# Patient Record
Sex: Female | Born: 1940
Health system: Southern US, Community
[De-identification: ages and names within clinical notes are randomized; demographics above are authoritative.]

## PROBLEM LIST (undated history)

## (undated) DIAGNOSIS — M1990 Unspecified osteoarthritis, unspecified site: Secondary | ICD-10-CM

## (undated) DIAGNOSIS — E785 Hyperlipidemia, unspecified: Secondary | ICD-10-CM

## (undated) DIAGNOSIS — T7840XA Allergy, unspecified, initial encounter: Secondary | ICD-10-CM

## (undated) DIAGNOSIS — N183 Chronic kidney disease, stage 3 unspecified: Secondary | ICD-10-CM

## (undated) DIAGNOSIS — M810 Age-related osteoporosis without current pathological fracture: Secondary | ICD-10-CM

## (undated) DIAGNOSIS — I10 Essential (primary) hypertension: Secondary | ICD-10-CM

## (undated) HISTORY — DX: Allergy, unspecified, initial encounter: T78.40XA

## (undated) HISTORY — DX: Hyperlipidemia, unspecified: E78.5

## (undated) HISTORY — DX: Age-related osteoporosis without current pathological fracture: M81.0

## (undated) HISTORY — DX: Chronic kidney disease, stage 3 (moderate): N18.3

## (undated) HISTORY — DX: Essential (primary) hypertension: I10

## (undated) HISTORY — DX: Unspecified osteoarthritis, unspecified site: M19.90

## (undated) HISTORY — DX: Chronic kidney disease, stage 3 unspecified: N18.30

## (undated) HISTORY — PX: EYE SURGERY: SHX253

---

## 2002-09-10 ENCOUNTER — Inpatient Hospital Stay (HOSPITAL_COMMUNITY): Admission: EM | Admit: 2002-09-10 | Discharge: 2002-09-11 | Payer: Self-pay | Admitting: Emergency Medicine

## 2002-09-10 ENCOUNTER — Encounter: Payer: Self-pay | Admitting: *Deleted

## 2002-09-10 ENCOUNTER — Encounter: Payer: Self-pay | Admitting: Internal Medicine

## 2002-09-27 ENCOUNTER — Encounter: Admission: RE | Admit: 2002-09-27 | Discharge: 2002-09-27 | Payer: Self-pay | Admitting: Internal Medicine

## 2002-09-27 ENCOUNTER — Encounter: Payer: Self-pay | Admitting: Internal Medicine

## 2002-10-31 ENCOUNTER — Encounter: Payer: Self-pay | Admitting: Internal Medicine

## 2002-10-31 ENCOUNTER — Encounter: Admission: RE | Admit: 2002-10-31 | Discharge: 2002-10-31 | Payer: Self-pay | Admitting: Internal Medicine

## 2002-11-22 ENCOUNTER — Encounter: Admission: RE | Admit: 2002-11-22 | Discharge: 2002-11-22 | Payer: Self-pay | Admitting: Internal Medicine

## 2002-11-22 ENCOUNTER — Encounter: Payer: Self-pay | Admitting: Internal Medicine

## 2003-06-04 ENCOUNTER — Encounter: Admission: RE | Admit: 2003-06-04 | Discharge: 2003-06-04 | Payer: Self-pay | Admitting: Internal Medicine

## 2003-12-18 ENCOUNTER — Encounter: Admission: RE | Admit: 2003-12-18 | Discharge: 2003-12-18 | Payer: Self-pay | Admitting: Internal Medicine

## 2005-02-03 ENCOUNTER — Encounter: Admission: RE | Admit: 2005-02-03 | Discharge: 2005-02-03 | Payer: Self-pay | Admitting: Internal Medicine

## 2006-02-11 ENCOUNTER — Encounter: Admission: RE | Admit: 2006-02-11 | Discharge: 2006-02-11 | Payer: Self-pay | Admitting: Internal Medicine

## 2007-02-15 ENCOUNTER — Encounter: Admission: RE | Admit: 2007-02-15 | Discharge: 2007-02-15 | Payer: Self-pay | Admitting: Internal Medicine

## 2008-02-16 ENCOUNTER — Encounter: Admission: RE | Admit: 2008-02-16 | Discharge: 2008-02-16 | Payer: Self-pay | Admitting: Internal Medicine

## 2009-03-22 ENCOUNTER — Encounter: Admission: RE | Admit: 2009-03-22 | Discharge: 2009-03-22 | Payer: Self-pay | Admitting: Internal Medicine

## 2010-06-10 ENCOUNTER — Encounter: Admission: RE | Admit: 2010-06-10 | Discharge: 2010-06-10 | Payer: Self-pay | Admitting: Internal Medicine

## 2010-12-12 NOTE — Discharge Summary (Signed)
[name], [name]                        ACCOUNT NO.:  [id number]   MEDICAL RECORD NO.:  [id number]                   PATIENT TYPE:  INP   LOCATION:  4713                                 FACILITY:  [facility]   PHYSICIAN:  Deirdre Peer. Polite, M.D.              DATE OF BIRTH:  12/25/1940   DATE OF ADMISSION:  09/10/2002  DATE OF DISCHARGE:  09/11/2002                                 DISCHARGE SUMMARY   DISCHARGE DIAGNOSES:  1. Bilateral upper lobe pneumonia.  2. Hypertension.   DISCHARGE MEDICATIONS:  1. Tequin 400 mg daily for 13 more days.  2. Albuterol MDI 2 puffs every 6 hours.  3. Resume outpatient medications.   CONSULTATIONS:  None.   PROCEDURES AND STUDIES:  1. Chest x-ray September 10, 2002, revealed superimposed chronic lung     markings and opacity along the peripheral aspect of the left upper lung     zone.  CT of the chest 09/10/02 revealed several upper lobe opacities     bilaterally, question PE versus infiltrates.  There was a questionable 2     mm left upper lobe nodule and small right renal stone.  2. A 12-lead EKG September 10, 2002.   LABORATORY DATA:  Sodium 140, potassium 3.8, chloride 106, CO2 of 25,  glucose 241, BUN 9, creatinine 1.3, calcium 9.3.  WBCs 11.8.  RBCs 4.33,  hemoglobin 12.7, hematocrit 36.8, CK 56, CK-MB 3.1, troponin 0.01.   DISPOSITION:  The patient is being discharged home.   CONDITION ON DISCHARGE:  Stable.   HISTORY OF PRESENT ILLNESS:  A 70 year old female who presented to [facility] on 09/10/02 with a two-day history of shortness of breath and a  productive cough with yellow phlegm.  She was seen approximately two weeks  prior at [facility] for shortness of breath and wheezing.  Chest x-  ray with a questionable left upper lobe nodule.  Nebulizers were prescribed  and did help some.  Initial examination revealed basilar rhonchi and  expiratory wheezes.  She was hypoxic with a PO2 of 81.  Initial chest x-ray  with  left upper lobe nodule.  CT was several upper lobe opacities  bilaterally, question PE versus infiltrate, and a 2 mm left upper lobe  nodule and a right renal stone.  The patient was admitted for further  evaluation and treatment.   HOSPITAL COURSE:  1. Bilateral upper lobe pneumonia.  The patient was admitted.  She was     started on IV heparin for questionable PE, started on IV antibiotics and     nebulizers.  Cardiac enzymes were obtained to rule out cardiac source for     her shortness of breath.  Enzymes were negative.  A repeat CT was     performed due to the fact that her first CT was not performed under PE     protocol.  Repeat CT is negative for  PE.  The patient remained afebrile.     Her IV antibiotics were switched to p.o.  She is being discharged on     additional 13 days of antibiotics therapy along with albuterol MDI.  2. Hypertension controlled throughout her hospitalization.  Continue     outpatient medications.   DISCHARGE INSTRUCTIONS:  1. The patient has agreed to follow up with Dr. Candyce Churn. Allyne Gee on an     outpatient basis.  2. She should have a repeat chest x-ray in 1-2 weeks.     Stephanie [place]az[place], NP                      Deirdre Peer. Polite, M.D.    SJ/MEDQ  D:  09/11/2002  T:  09/11/2002  Job:  [id number]   cc:   Candyce Churn. Allyne Gee, M.D.  [address]  [address]  [place]  [place] [address]  Fax: [phone]

## 2010-12-12 NOTE — H&P (Signed)
Amanda [name], Amanda [name]                        ACCOUNT NO.:  [id number]   MEDICAL RECORD NO.:  [id number]                   PATIENT TYPE:  INP   LOCATION:  1823                                 FACILITY:  [facility]   PHYSICIAN:  Georgann Housekeeper, M.D.                 DATE OF BIRTH:  [birth year]   DATE OF ADMISSION:  09/09/2002  DATE OF DISCHARGE:                                HISTORY & PHYSICAL   CHIEF COMPLAINT:  Shortness of breath and wheezing.   HISTORY OF PRESENT ILLNESS:  The patient is a 70 year old female with no  significant past medical history.  She presented to the emergency room with  shortness of breath and wheezing, worsening over the last couple of days.  She states that about two weeks ago she was seen in [facility] for  shortness of breath and wheezing.  At that time she had elevated blood  pressure, was put on two blood pressure medications and also given albuterol  MDI's to use.  She did receive albuterol treatment in the hospital there and  felt better.  Chest x-ray showed questionable left upper lobe nodules she  was told to get an appointment with her primary doctor which she had never  had before, and was supposed to be seen by Dr. Elesa Massed her primary in the [facility] area next week.  The patient states that the albuterol MDI was helping  her for a week and then since about the last few days her shortness of  breath started getting worse.  She had a little bit of coughing with  occasional yellow sputum, no fevers, no chills, no nausea, vomiting, or  diarrhea.  Denies any chest pains.  In the emergency room she said she could  not breathe but felt better with oxygen.  Her lower saturations were 90 and  91% when she came in.  She has had no prior pulmonary or cardiac problems.  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   PAST MEDICAL HISTORY:  Questionable recent diagnosis of hypertension.   PAST SURGICAL HISTORY:  None.   INJURIES:  Left knee fracture, and right wrist.   MEDICATIONS:  Hydrochlorothiazide 12.5 mg daily, Clonidine 0.1 mg daily,  albuterol MDI p.r.n.   ALLERGIES:  Sulfa.   SOCIAL HISTORY:  She does not smoke, no secondary exposure.  No alcohol, no  coffee.  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   FAMILY HISTORY:  Father died of an abdominal aortic aneurysm rupture.  Mother is 43 with hypertension, coronary artery disease.   REVIEW OF SYSTEMS:  The patient denies any fevers, no leg swelling, no  chest  pain or dizziness.  No recent travels.  No recent medications apart from the  blood pressure pill she has been put on.   PHYSICAL EXAMINATION:  VITAL SIGNS: Here in the ER her temperature is 99.4,  blood pressure 146/60, heart rate of 110 to 115, sinus tachycardia,  respiratory 22, saturation 96% on two liters.  GENERAL:  A female in no acute distress, feeling better on oxygen.  HEENT:  Pupils equal, reactive. Extraocular movements are intact.  NECK: Supple, no jugular venous distension.  LUNGS: Bibasilar rhonchi and wheezing.  CARDIAC:  Regular S1, S2 without any murmurs.  No S3.  ABDOMEN: Soft, positive bowel sounds, nontender.  EXTREMITIES: No edema.   LABORATORY DATA:  White count is 11,800, hemoglobin 12.7, platelets 202,000.  Chemistries - sodium 139, potassium 4.1, glucose 161, BUN 14, creatinine  0.9.  CK total was 56, MB of 3.1, troponin 0.01.  D-dimer was 0.75 which is  positive.  ABG shows pH 7.46, PCO2 of 31, PO2 81.  EKG: sinus tachycardia,  no acute changes.  Chest x-ray initially showed left upper lobe nodules, no infiltrate.  Chest  CT was done and showed several upper lobe opacities bilaterally,  questionable PE versus infiltrates.  There were 2 mm left upper lobe  nodules and a small  right renal stone.   This is a 70 year old female with shortness of breath and wheezing.  Chest  CAT scan finding significant for some opacities, unclear if these are  infiltrates, but suspicious for pulmonary embolus.  The patient had also  been wheezing and has a cough with some yellow productive sputum and  elevated white count, low grade temperature.   IMPRESSION:  1. Shortness of breath, differential includes a bronchitis picture versus to     pulmonary embolus with abdominal/chest CT.  The patient did not have a CT     for PE protocol and because of the wheezing, decided to hold off on the     VQ scan and had better repeat a CT scan in 12 hours with PE protocol.   PLAN:  I will put her on heparin as pharmacy protocol for pulmonary embolus.  For questionable bronchitis and wheezing will start her on Rocephin and  Zithromax with oxygen and nebulizer, Xopenex because of her tachycardia.  Hold of on Prednisone.  As far as hypertension, continue on Clonidine, hold  off on hydrochlorothiazide.  Also give her IV fluids with normal saline and  if the CT for PE is negative we might consider stopping the heparin and  continue with the antibiotics and nebulizer treatments.  As far as this  abdominal CT, if any question, might get a pulmonary consult.                                                 Georgann Housekeeper, M.D.    KH/MEDQ  D:  09/10/2002  T:  09/10/2002  Job:  [id number]

## 2011-07-16 ENCOUNTER — Other Ambulatory Visit: Payer: Self-pay | Admitting: Internal Medicine

## 2011-07-16 DIAGNOSIS — Z1231 Encounter for screening mammogram for malignant neoplasm of breast: Secondary | ICD-10-CM

## 2011-08-06 ENCOUNTER — Ambulatory Visit
Admission: RE | Admit: 2011-08-06 | Discharge: 2011-08-06 | Disposition: A | Discharge status: Home / Self Care | Payer: BC Managed Care – PPO | Source: Ambulatory Visit | Attending: Internal Medicine | Admitting: Internal Medicine

## 2011-08-06 DIAGNOSIS — Z1231 Encounter for screening mammogram for malignant neoplasm of breast: Secondary | ICD-10-CM

## 2012-08-18 ENCOUNTER — Other Ambulatory Visit: Payer: Self-pay | Admitting: Internal Medicine

## 2012-08-18 DIAGNOSIS — Z1231 Encounter for screening mammogram for malignant neoplasm of breast: Secondary | ICD-10-CM

## 2012-09-13 ENCOUNTER — Ambulatory Visit
Admission: RE | Admit: 2012-09-13 | Discharge: 2012-09-13 | Disposition: A | Discharge status: Home / Self Care | Payer: BC Managed Care – PPO | Source: Ambulatory Visit | Attending: Internal Medicine | Admitting: Internal Medicine

## 2012-09-13 ENCOUNTER — Other Ambulatory Visit: Payer: Self-pay | Admitting: Internal Medicine

## 2012-09-13 DIAGNOSIS — M549 Dorsalgia, unspecified: Secondary | ICD-10-CM

## 2012-09-13 DIAGNOSIS — Z1231 Encounter for screening mammogram for malignant neoplasm of breast: Secondary | ICD-10-CM

## 2013-10-17 ENCOUNTER — Other Ambulatory Visit: Payer: Self-pay

## 2013-10-17 DIAGNOSIS — Z1231 Encounter for screening mammogram for malignant neoplasm of breast: Secondary | ICD-10-CM

## 2013-11-08 ENCOUNTER — Ambulatory Visit
Admission: RE | Admit: 2013-11-08 | Discharge: 2013-11-08 | Disposition: A | Discharge status: Home / Self Care | Payer: BC Managed Care – PPO | Source: Ambulatory Visit

## 2013-11-08 DIAGNOSIS — Z1231 Encounter for screening mammogram for malignant neoplasm of breast: Secondary | ICD-10-CM

## 2014-10-17 IMAGING — CR DG THORACIC SPINE 3V
3 series · 3 of 3 positions shown · non-contrast
Comparison: Single view chest x-ray of 2444 with CT chest from 2444

CLINICAL DATA: Upper back pain for several months, no trauma

THORACIC SPINE - 2 VIEW + SWIMMERS

[t t-spine a.p.]
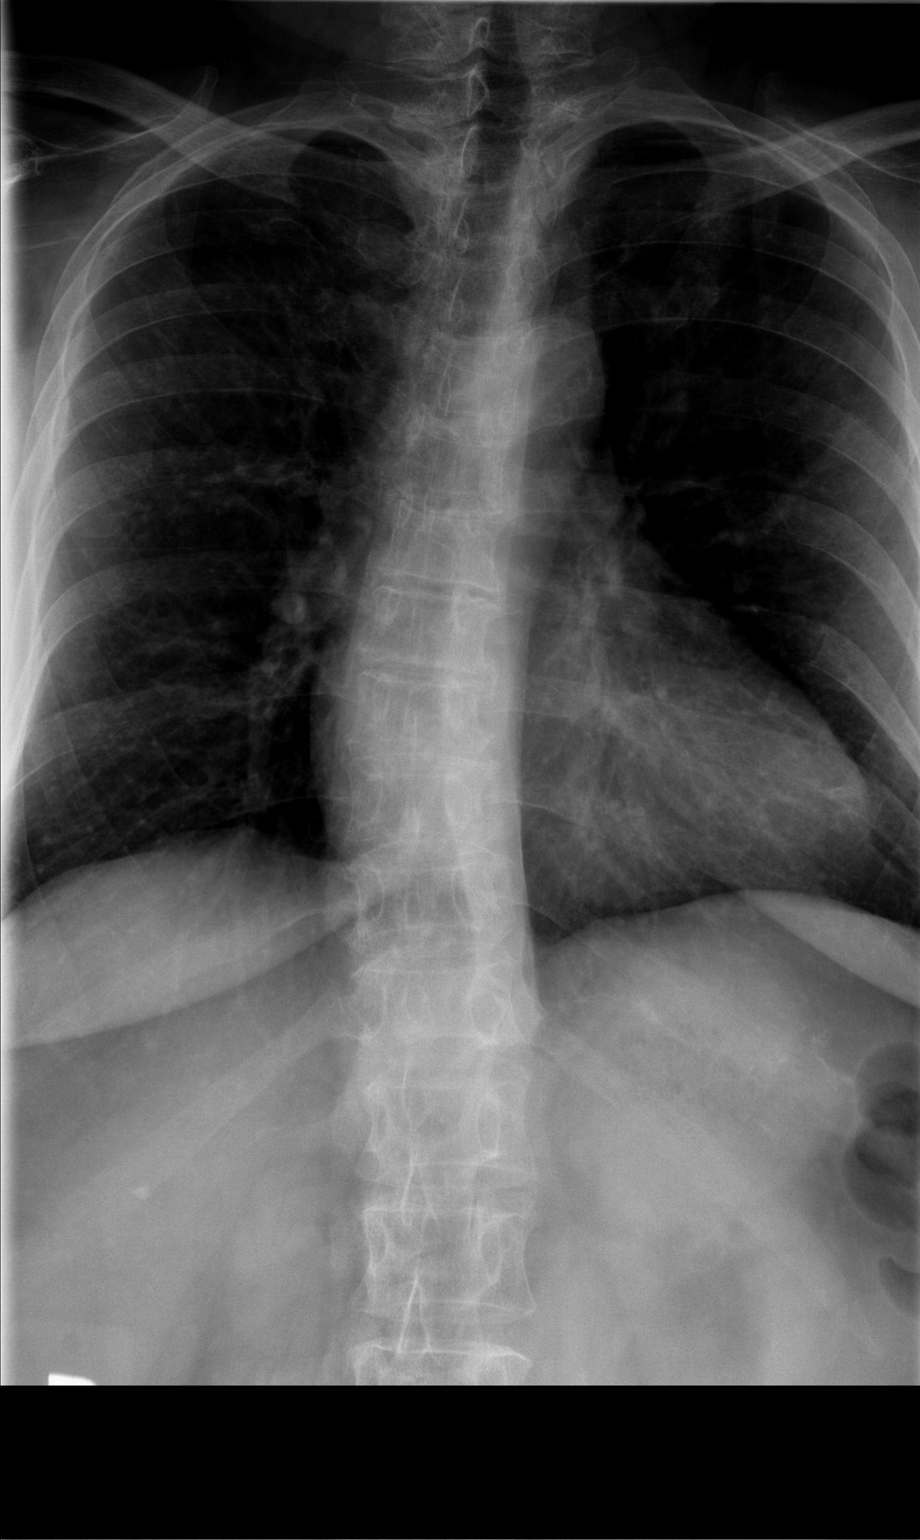

[t t-spine lat *]
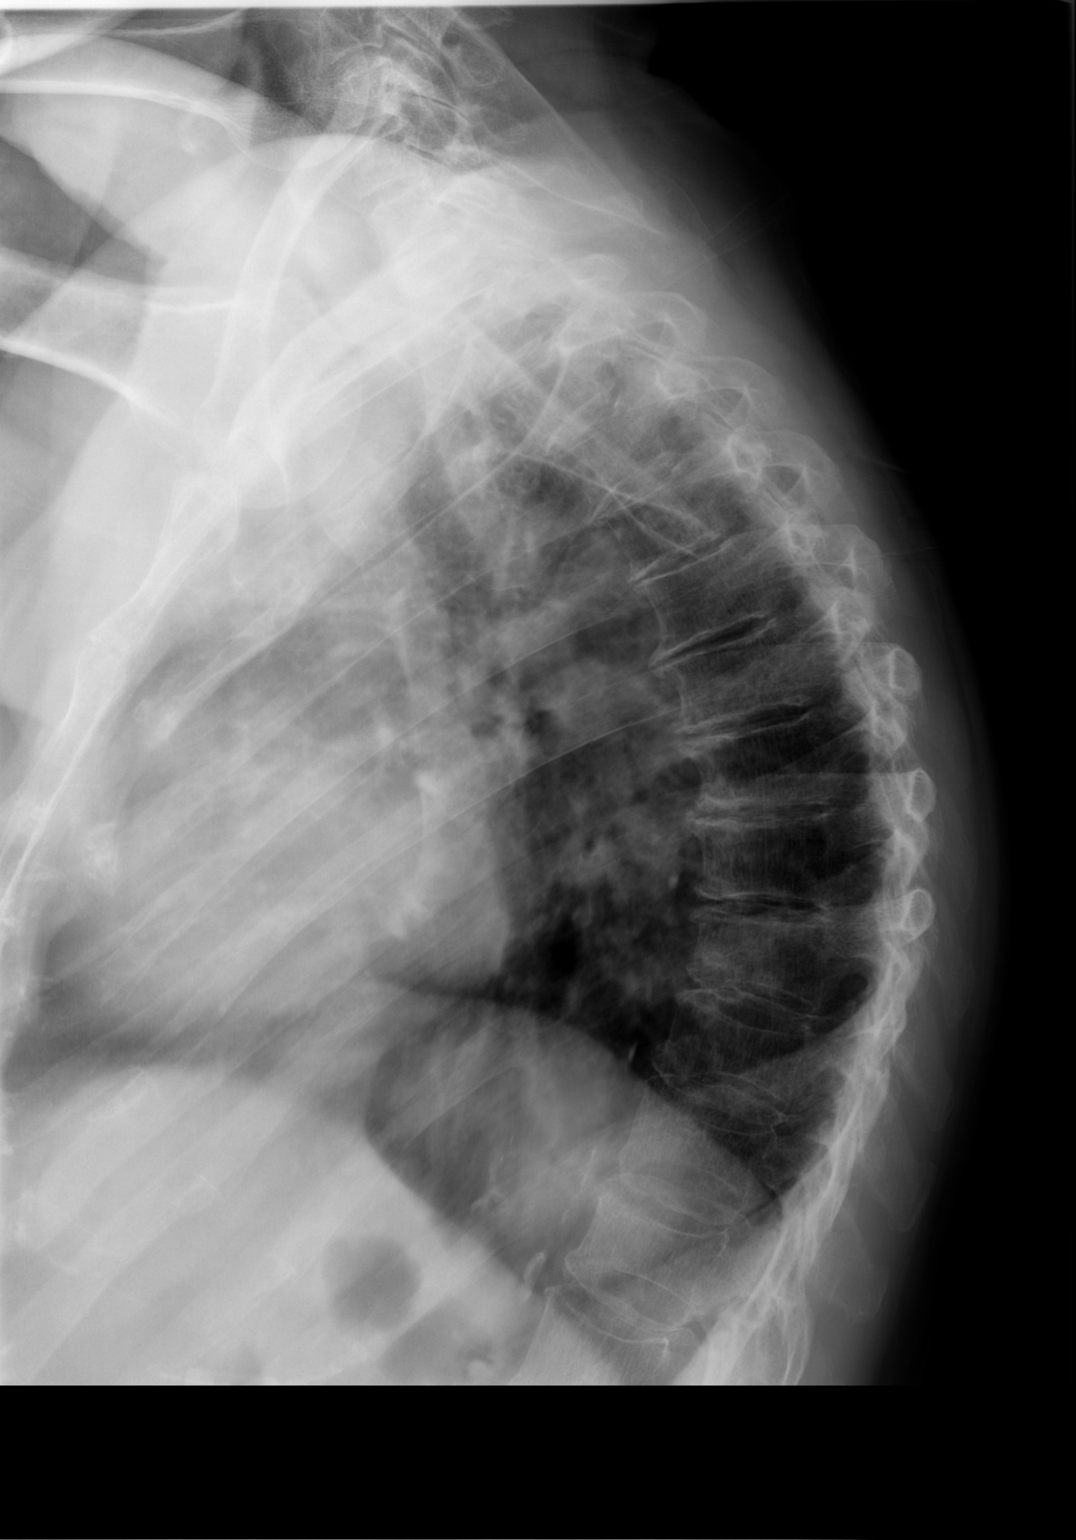

[t swimmers]
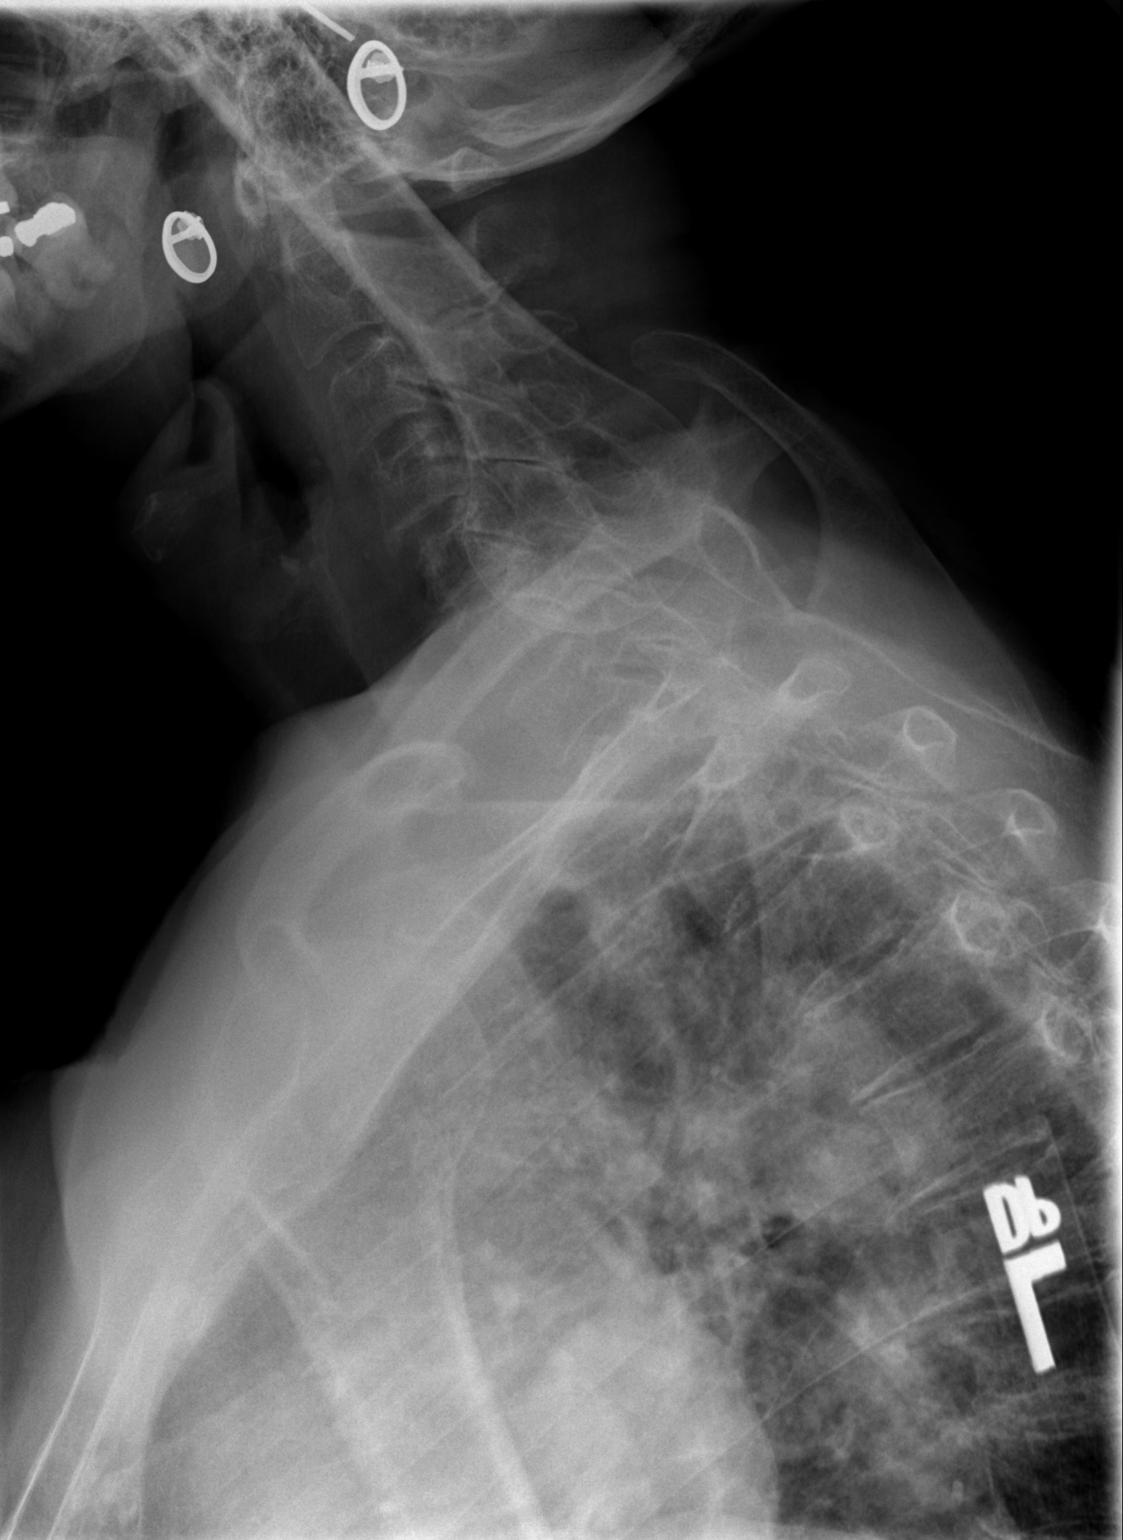

[3 of 3 positions shown; findings below may reference images not displayed]

FINDINGS: The bones are diffusely osteopenic.  There is a mild
thoracic scoliosis convex to the right with degenerative change.
No compression deformity is seen.
IMPRESSION: Mild thoracic scoliosis with degenerative change.  No acute
abnormality.

## 2014-12-31 DIAGNOSIS — Z1389 Encounter for screening for other disorder: Secondary | ICD-10-CM | POA: Diagnosis not present

## 2014-12-31 DIAGNOSIS — R413 Other amnesia: Secondary | ICD-10-CM | POA: Diagnosis not present

## 2014-12-31 DIAGNOSIS — E780 Pure hypercholesterolemia: Secondary | ICD-10-CM | POA: Diagnosis not present

## 2014-12-31 DIAGNOSIS — Z0000 Encounter for general adult medical examination without abnormal findings: Secondary | ICD-10-CM | POA: Diagnosis not present

## 2014-12-31 DIAGNOSIS — E559 Vitamin D deficiency, unspecified: Secondary | ICD-10-CM | POA: Diagnosis not present

## 2014-12-31 DIAGNOSIS — N183 Chronic kidney disease, stage 3 (moderate): Secondary | ICD-10-CM | POA: Diagnosis not present

## 2014-12-31 DIAGNOSIS — R7309 Other abnormal glucose: Secondary | ICD-10-CM | POA: Diagnosis not present

## 2014-12-31 DIAGNOSIS — I129 Hypertensive chronic kidney disease with stage 1 through stage 4 chronic kidney disease, or unspecified chronic kidney disease: Secondary | ICD-10-CM | POA: Diagnosis not present

## 2015-01-30 DIAGNOSIS — Z13820 Encounter for screening for osteoporosis: Secondary | ICD-10-CM | POA: Diagnosis not present

## 2015-05-23 DIAGNOSIS — Z23 Encounter for immunization: Secondary | ICD-10-CM | POA: Diagnosis not present

## 2015-07-01 DIAGNOSIS — E7800 Pure hypercholesterolemia, unspecified: Secondary | ICD-10-CM | POA: Diagnosis not present

## 2015-07-01 DIAGNOSIS — N183 Chronic kidney disease, stage 3 (moderate): Secondary | ICD-10-CM | POA: Diagnosis not present

## 2015-07-01 DIAGNOSIS — I129 Hypertensive chronic kidney disease with stage 1 through stage 4 chronic kidney disease, or unspecified chronic kidney disease: Secondary | ICD-10-CM | POA: Diagnosis not present

## 2015-07-01 DIAGNOSIS — R7309 Other abnormal glucose: Secondary | ICD-10-CM | POA: Diagnosis not present

## 2015-08-20 DIAGNOSIS — E785 Hyperlipidemia, unspecified: Secondary | ICD-10-CM | POA: Diagnosis not present

## 2015-08-20 DIAGNOSIS — Z79899 Other long term (current) drug therapy: Secondary | ICD-10-CM | POA: Diagnosis not present

## 2016-01-08 DIAGNOSIS — I129 Hypertensive chronic kidney disease with stage 1 through stage 4 chronic kidney disease, or unspecified chronic kidney disease: Secondary | ICD-10-CM | POA: Diagnosis not present

## 2016-01-08 DIAGNOSIS — N182 Chronic kidney disease, stage 2 (mild): Secondary | ICD-10-CM | POA: Diagnosis not present

## 2016-01-27 DIAGNOSIS — E785 Hyperlipidemia, unspecified: Secondary | ICD-10-CM | POA: Diagnosis not present

## 2016-01-27 DIAGNOSIS — N182 Chronic kidney disease, stage 2 (mild): Secondary | ICD-10-CM | POA: Diagnosis not present

## 2016-01-27 DIAGNOSIS — I129 Hypertensive chronic kidney disease with stage 1 through stage 4 chronic kidney disease, or unspecified chronic kidney disease: Secondary | ICD-10-CM | POA: Diagnosis not present

## 2016-01-27 DIAGNOSIS — E559 Vitamin D deficiency, unspecified: Secondary | ICD-10-CM | POA: Diagnosis not present

## 2016-01-27 DIAGNOSIS — N183 Chronic kidney disease, stage 3 (moderate): Secondary | ICD-10-CM | POA: Diagnosis not present

## 2016-01-27 DIAGNOSIS — R7309 Other abnormal glucose: Secondary | ICD-10-CM | POA: Diagnosis not present

## 2016-01-27 DIAGNOSIS — Z0000 Encounter for general adult medical examination without abnormal findings: Secondary | ICD-10-CM | POA: Diagnosis not present

## 2016-03-09 DIAGNOSIS — Z79899 Other long term (current) drug therapy: Secondary | ICD-10-CM | POA: Diagnosis not present

## 2016-03-09 DIAGNOSIS — I129 Hypertensive chronic kidney disease with stage 1 through stage 4 chronic kidney disease, or unspecified chronic kidney disease: Secondary | ICD-10-CM | POA: Diagnosis not present

## 2016-03-09 DIAGNOSIS — N183 Chronic kidney disease, stage 3 (moderate): Secondary | ICD-10-CM | POA: Diagnosis not present

## 2016-03-23 ENCOUNTER — Other Ambulatory Visit: Payer: Self-pay

## 2016-04-02 DIAGNOSIS — Z23 Encounter for immunization: Secondary | ICD-10-CM | POA: Diagnosis not present

## 2017-06-22 DIAGNOSIS — J320 Chronic maxillary sinusitis: Secondary | ICD-10-CM | POA: Diagnosis not present

## 2017-06-22 DIAGNOSIS — E785 Hyperlipidemia, unspecified: Secondary | ICD-10-CM | POA: Diagnosis not present

## 2017-06-22 DIAGNOSIS — R7309 Other abnormal glucose: Secondary | ICD-10-CM | POA: Diagnosis not present

## 2017-06-22 DIAGNOSIS — N182 Chronic kidney disease, stage 2 (mild): Secondary | ICD-10-CM | POA: Diagnosis not present

## 2017-06-22 DIAGNOSIS — Z0000 Encounter for general adult medical examination without abnormal findings: Secondary | ICD-10-CM | POA: Diagnosis not present

## 2017-06-22 DIAGNOSIS — I129 Hypertensive chronic kidney disease with stage 1 through stage 4 chronic kidney disease, or unspecified chronic kidney disease: Secondary | ICD-10-CM | POA: Diagnosis not present

## 2017-06-22 DIAGNOSIS — E559 Vitamin D deficiency, unspecified: Secondary | ICD-10-CM | POA: Diagnosis not present

## 2017-07-02 DIAGNOSIS — Z23 Encounter for immunization: Secondary | ICD-10-CM | POA: Diagnosis not present

## 2017-08-03 DIAGNOSIS — J321 Chronic frontal sinusitis: Secondary | ICD-10-CM | POA: Diagnosis not present

## 2017-08-03 DIAGNOSIS — N183 Chronic kidney disease, stage 3 (moderate): Secondary | ICD-10-CM | POA: Diagnosis not present

## 2017-08-03 DIAGNOSIS — R000 Tachycardia, unspecified: Secondary | ICD-10-CM | POA: Diagnosis not present

## 2017-08-03 DIAGNOSIS — I129 Hypertensive chronic kidney disease with stage 1 through stage 4 chronic kidney disease, or unspecified chronic kidney disease: Secondary | ICD-10-CM | POA: Diagnosis not present

## 2017-08-03 DIAGNOSIS — Z79899 Other long term (current) drug therapy: Secondary | ICD-10-CM | POA: Diagnosis not present

## 2017-12-01 DIAGNOSIS — I129 Hypertensive chronic kidney disease with stage 1 through stage 4 chronic kidney disease, or unspecified chronic kidney disease: Secondary | ICD-10-CM | POA: Diagnosis not present

## 2017-12-01 DIAGNOSIS — R7309 Other abnormal glucose: Secondary | ICD-10-CM | POA: Diagnosis not present

## 2017-12-01 DIAGNOSIS — N183 Chronic kidney disease, stage 3 (moderate): Secondary | ICD-10-CM | POA: Diagnosis not present

## 2017-12-01 DIAGNOSIS — E785 Hyperlipidemia, unspecified: Secondary | ICD-10-CM | POA: Diagnosis not present

## 2017-12-01 DIAGNOSIS — Z79899 Other long term (current) drug therapy: Secondary | ICD-10-CM | POA: Diagnosis not present

## 2018-02-25 ENCOUNTER — Other Ambulatory Visit: Payer: Self-pay

## 2018-06-27 DIAGNOSIS — Z23 Encounter for immunization: Secondary | ICD-10-CM | POA: Diagnosis not present

## 2018-07-12 ENCOUNTER — Encounter: Payer: Self-pay | Admitting: Internal Medicine

## 2018-07-12 ENCOUNTER — Ambulatory Visit (INDEPENDENT_AMBULATORY_CARE_PROVIDER_SITE_OTHER): Payer: Medicare Other | Admitting: Internal Medicine

## 2018-07-12 VITALS — BP 156/88 | HR 109 | Temp 97.8°F | Ht 59.0 in | Wt 141.2 lb

## 2018-07-12 DIAGNOSIS — R7309 Other abnormal glucose: Secondary | ICD-10-CM | POA: Diagnosis not present

## 2018-07-12 DIAGNOSIS — E2839 Other primary ovarian failure: Secondary | ICD-10-CM

## 2018-07-12 DIAGNOSIS — Z6828 Body mass index (BMI) 28.0-28.9, adult: Secondary | ICD-10-CM | POA: Insufficient documentation

## 2018-07-12 DIAGNOSIS — I129 Hypertensive chronic kidney disease with stage 1 through stage 4 chronic kidney disease, or unspecified chronic kidney disease: Secondary | ICD-10-CM | POA: Diagnosis not present

## 2018-07-12 DIAGNOSIS — N183 Chronic kidney disease, stage 3 unspecified: Secondary | ICD-10-CM | POA: Insufficient documentation

## 2018-07-12 DIAGNOSIS — E7800 Pure hypercholesterolemia, unspecified: Secondary | ICD-10-CM

## 2018-07-12 DIAGNOSIS — Z0000 Encounter for general adult medical examination without abnormal findings: Secondary | ICD-10-CM

## 2018-07-12 LAB — POCT URINALYSIS DIPSTICK
Blood, UA: NEGATIVE
Glucose, UA: NEGATIVE
Ketones, UA: POSITIVE
Nitrite, UA: NEGATIVE
Protein, UA: NEGATIVE
Spec Grav, UA: 1.02 (ref 1.010–1.025)
Urobilinogen, UA: 0.2 E.U./dL
pH, UA: 5.5 (ref 5.0–8.0)

## 2018-07-12 LAB — POCT UA - MICROALBUMIN
Albumin/Creatinine Ratio, Urine, POC: 30
Creatinine, POC: 200 mg/dL
Microalbumin Ur, POC: 30 mg/L

## 2018-07-12 NOTE — Patient Instructions (Signed)
DASH Eating Plan DASH stands for "Dietary Approaches to Stop Hypertension." The DASH eating plan is a healthy eating plan that has been shown to reduce high blood pressure (hypertension). It may also reduce your risk for type 2 diabetes, heart disease, and stroke. The DASH eating plan may also help with weight loss. What are tips for following this plan? General guidelines  Avoid eating more than 2,300 mg (milligrams) of salt (sodium) a day. If you have hypertension, you may need to reduce your sodium intake to 1,500 mg a day.  Limit alcohol intake to no more than 1 drink a day for nonpregnant women and 2 drinks a day for men. One drink equals 12 oz of beer, 5 oz of wine, or 1 oz of hard liquor.  Work with your health care provider to maintain a healthy body weight or to lose weight. Ask what an ideal weight is for you.  Get at least 30 minutes of exercise that causes your heart to beat faster (aerobic exercise) most days of the week. Activities may include walking, swimming, or biking.  Work with your health care provider or diet and nutrition specialist (dietitian) to adjust your eating plan to your individual calorie needs. Reading food labels  Check food labels for the amount of sodium per serving. Choose foods with less than 5 percent of the Daily Value of sodium. Generally, foods with less than 300 mg of sodium per serving fit into this eating plan.  To find whole grains, look for the word "whole" as the first word in the ingredient list. Shopping  Buy products labeled as "low-sodium" or "no salt added."  Buy fresh foods. Avoid canned foods and premade or frozen meals. Cooking  Avoid adding salt when cooking. Use salt-free seasonings or herbs instead of table salt or sea salt. Check with your health care provider or pharmacist before using salt substitutes.  Do not fry foods. Cook foods using healthy methods such as baking, boiling, grilling, and broiling instead.  Cook with  heart-healthy oils, such as olive, canola, soybean, or sunflower oil. Meal planning   Eat a balanced diet that includes: ? 5 or more servings of fruits and vegetables each day. At each meal, try to fill half of your plate with fruits and vegetables. ? Up to 6-8 servings of whole grains each day. ? Less than 6 oz of lean meat, poultry, or fish each day. A 3-oz serving of meat is about the same size as a deck of cards. One egg equals 1 oz. ? 2 servings of low-fat dairy each day. ? A serving of nuts, seeds, or beans 5 times each week. ? Heart-healthy fats. Healthy fats called Omega-3 fatty acids are found in foods such as flaxseeds and coldwater fish, like sardines, salmon, and mackerel.  Limit how much you eat of the following: ? Canned or prepackaged foods. ? Food that is high in trans fat, such as fried foods. ? Food that is high in saturated fat, such as fatty meat. ? Sweets, desserts, sugary drinks, and other foods with added sugar. ? Full-fat dairy products.  Do not salt foods before eating.  Try to eat at least 2 vegetarian meals each week.  Eat more home-cooked food and less restaurant, buffet, and fast food.  When eating at a restaurant, ask that your food be prepared with less salt or no salt, if possible. What foods are recommended? The items listed may not be a complete list. Talk with your dietitian about what   dietary choices are best for you. Grains Whole-grain or whole-wheat bread. Whole-grain or whole-wheat pasta. Brown rice. Oatmeal. Quinoa. Bulgur. Whole-grain and low-sodium cereals. Pita bread. Low-fat, low-sodium crackers. Whole-wheat flour tortillas. Vegetables Fresh or frozen vegetables (raw, steamed, roasted, or grilled). Low-sodium or reduced-sodium tomato and vegetable juice. Low-sodium or reduced-sodium tomato sauce and tomato paste. Low-sodium or reduced-sodium canned vegetables. Fruits All fresh, dried, or frozen fruit. Canned fruit in natural juice (without  added sugar). Meat and other protein foods Skinless chicken or turkey. Ground chicken or turkey. Pork with fat trimmed off. Fish and seafood. Egg whites. Dried beans, peas, or lentils. Unsalted nuts, nut butters, and seeds. Unsalted canned beans. Lean cuts of beef with fat trimmed off. Low-sodium, lean deli meat. Dairy Low-fat (1%) or fat-free (skim) milk. Fat-free, low-fat, or reduced-fat cheeses. Nonfat, low-sodium ricotta or cottage cheese. Low-fat or nonfat yogurt. Low-fat, low-sodium cheese. Fats and oils Soft margarine without trans fats. Vegetable oil. Low-fat, reduced-fat, or light mayonnaise and salad dressings (reduced-sodium). Canola, safflower, olive, soybean, and sunflower oils. Avocado. Seasoning and other foods Herbs. Spices. Seasoning mixes without salt. Unsalted popcorn and pretzels. Fat-free sweets. What foods are not recommended? The items listed may not be a complete list. Talk with your dietitian about what dietary choices are best for you. Grains Baked goods made with fat, such as croissants, muffins, or some breads. Dry pasta or rice meal packs. Vegetables Creamed or fried vegetables. Vegetables in a cheese sauce. Regular canned vegetables (not low-sodium or reduced-sodium). Regular canned tomato sauce and paste (not low-sodium or reduced-sodium). Regular tomato and vegetable juice (not low-sodium or reduced-sodium). Pickles. Olives. Fruits Canned fruit in a light or heavy syrup. Fried fruit. Fruit in cream or butter sauce. Meat and other protein foods Fatty cuts of meat. Ribs. Fried meat. Bacon. Sausage. Bologna and other processed lunch meats. Salami. Fatback. Hotdogs. Bratwurst. Salted nuts and seeds. Canned beans with added salt. Canned or smoked fish. Whole eggs or egg yolks. Chicken or turkey with skin. Dairy Whole or 2% milk, cream, and half-and-half. Whole or full-fat cream cheese. Whole-fat or sweetened yogurt. Full-fat cheese. Nondairy creamers. Whipped toppings.  Processed cheese and cheese spreads. Fats and oils Butter. Stick margarine. Lard. Shortening. Ghee. Bacon fat. Tropical oils, such as coconut, palm kernel, or palm oil. Seasoning and other foods Salted popcorn and pretzels. Onion salt, garlic salt, seasoned salt, table salt, and sea salt. Worcestershire sauce. Tartar sauce. Barbecue sauce. Teriyaki sauce. Soy sauce, including reduced-sodium. Steak sauce. Canned and packaged gravies. Fish sauce. Oyster sauce. Cocktail sauce. Horseradish that you find on the shelf. Ketchup. Mustard. Meat flavorings and tenderizers. Bouillon cubes. Hot sauce and Tabasco sauce. Premade or packaged marinades. Premade or packaged taco seasonings. Relishes. Regular salad dressings. Where to find more information:  National Heart, Lung, and Blood Institute: www.nhlbi.nih.gov  American Heart Association: www.heart.org Summary  The DASH eating plan is a healthy eating plan that has been shown to reduce high blood pressure (hypertension). It may also reduce your risk for type 2 diabetes, heart disease, and stroke.  With the DASH eating plan, you should limit salt (sodium) intake to 2,300 mg a day. If you have hypertension, you may need to reduce your sodium intake to 1,500 mg a day.  When on the DASH eating plan, aim to eat more fresh fruits and vegetables, whole grains, lean proteins, low-fat dairy, and heart-healthy fats.  Work with your health care provider or diet and nutrition specialist (dietitian) to adjust your eating plan to your individual   calorie needs. This information is not intended to replace advice given to you by your health care provider. Make sure you discuss any questions you have with your health care provider. Document Released: 07/02/2011 Document Revised: 07/06/2016 Document Reviewed: 07/06/2016 Elsevier Interactive Patient Education  2018 [employer].  

## 2018-07-13 LAB — CMP14+EGFR
ALT: 51 IU/L — ABNORMAL HIGH (ref 0–32)
AST: 69 IU/L — ABNORMAL HIGH (ref 0–40)
Albumin/Globulin Ratio: 2 (ref 1.2–2.2)
Albumin: 4.8 g/dL (ref 3.5–4.8)
Alkaline Phosphatase: 116 IU/L (ref 39–117)
BUN/Creatinine Ratio: 18 (ref 12–28)
BUN: 18 mg/dL (ref 8–27)
Bilirubin Total: 0.5 mg/dL (ref 0.0–1.2)
CO2: 21 mmol/L (ref 20–29)
Calcium: 10.1 mg/dL (ref 8.7–10.3)
Chloride: 96 mmol/L (ref 96–106)
Creatinine, Ser: 0.98 mg/dL (ref 0.57–1.00)
GFR calc Af Amer: 63 mL/min/{1.73_m2} (ref >59)
GFR calc non Af Amer: 55 mL/min/{1.73_m2} — ABNORMAL LOW (ref >59)
Globulin, Total: 2.4 g/dL (ref 1.5–4.5)
Glucose: 107 mg/dL — ABNORMAL HIGH (ref 65–99)
Potassium: 4.2 mmol/L (ref 3.5–5.2)
Sodium: 137 mmol/L (ref 134–144)
Total Protein: 7.2 g/dL (ref 6.0–8.5)

## 2018-07-13 LAB — LIPID PANEL
Chol/HDL Ratio: 3.3 ratio (ref 0.0–4.4)
Cholesterol, Total: 190 mg/dL (ref 100–199)
HDL: 57 mg/dL (ref >39)
LDL Calculated: 107 mg/dL — ABNORMAL HIGH (ref 0–99)
Triglycerides: 132 mg/dL (ref 0–149)
VLDL Cholesterol Cal: 26 mg/dL (ref 5–40)

## 2018-07-13 LAB — HEMOGLOBIN A1C
Est. average glucose Bld gHb Est-mCnc: 114 mg/dL
Hgb A1c MFr Bld: 5.6 % (ref 4.8–5.6)

## 2018-07-13 NOTE — Progress Notes (Signed)
Your liver fxn is elevated. Kw-pls schedule LAB visit in four weeks to repeat liver functoin. Dx: abnormal LFTs, If you have been taking tylenol, motrin etc - pls stop. If you drink alcohol, pls stop. If you eat sugar, or drink sugary drinks, pls stop.  kidney fxn is stable. You are NOT prediabetic. Your bad chol is 107, ideally less than 100. You are close! After the holidays, work on incorporating more exercise into your daily routine.

## 2018-07-31 ENCOUNTER — Encounter: Payer: Self-pay | Admitting: Internal Medicine

## 2018-07-31 NOTE — Progress Notes (Signed)
Subjective:   Amanda [name] is a 78 y.o. female who presents for Medicare Annual (Subsequent) preventive examination.  HPI  Hypertension  This is a chronic problem. The current episode started more than 1 year ago. The problem has been gradually improving since onset. The problem is uncontrolled. Pertinent negatives include no blurred vision, chest pain, palpitations or sweats. Risk factors for coronary artery disease include dyslipidemia, post-menopausal state and sedentary lifestyle. Identifiable causes of hypertension include chronic renal disease.  Hyperlipidemia  This is a chronic problem. The current episode started more than 1 year ago. The problem is controlled. Recent lipid tests were reviewed and are high. Exacerbating diseases include chronic renal disease. Pertinent negatives include no chest pain.  She reports compliance with meds.    Review of Systems:   Review of Systems  Constitutional: Negative.   Eyes: Negative for blurred vision.  Respiratory: Negative.   Cardiovascular: Negative.  Negative for chest pain and palpitations.  Genitourinary: Negative.   Musculoskeletal: Negative.   Neurological: Negative.   Psychiatric/Behavioral: Negative.    Cardiac Risk Factors include: hypertension, hyperlipidemia and postmenopausal state.      Objective:     Vitals: BP (!) 156/88 (BP Location: Left Arm, Patient Position: Sitting, Cuff Size: Normal)   Pulse (!) 109   Temp 97.8 F (36.6 C) (Oral)   Ht '4\' 11"'$  (1.499 m)   Wt 141 lb 3.2 oz (64 kg)   BMI 28.52 kg/m   Body mass index is 28.52 kg/m.  No flowsheet data found.  Tobacco Social History   Tobacco Use  Smoking Status Never Smoker  Smokeless Tobacco Never Used     Counseling given: Not Answered   Clinical Intake:  Pre-visit preparation completed: No  Pain : No/denies pain Pain Score: 0-No pain  BMI - recorded: 28 Nutritional Status: BMI 25 -29 Overweight Diabetes: No  How often do you need  to have someone help you when you read instructions, pamphlets, or other written materials from your doctor or pharmacy?: 1 - Never What is the last grade level you completed in school?: 12th grade  Interpreter Needed?: No  Information entered by :: Glendale Chard, MD  Past Medical History:  Diagnosis Date  . CKD (chronic kidney disease) stage 3, GFR 30-59 ml/min (HCC)   . HTN (hypertension)   . Hyperlipidemia    History reviewed. No pertinent surgical history. Family History  Problem Relation Age of Onset  . Hypertension Mother   . Anuerysm Father   . Hypotension Father    Social History   Socioeconomic History  . Marital status: Married    Spouse name: Not on file  . Number of children: Not on file  . Years of education: Not on file  . Highest education level: Not on file  Occupational History  . Not on file  Social Needs  . Financial resource strain: Not on file  . Food insecurity:    Worry: Not on file    Inability: Not on file  . Transportation needs:    Medical: Not on file    Non-medical: Not on file  Tobacco Use  . Smoking status: Never Smoker  . Smokeless tobacco: Never Used  Substance and Sexual Activity  . Alcohol use: Never    Frequency: Never  . Drug use: Not on file  . Sexual activity: Not on file  Lifestyle  . Physical activity:    Days per week: Not on file    Minutes per session: Not on  file  . Stress: Not on file  Relationships  . Social connections:    Talks on phone: Not on file    Gets together: Not on file    Attends religious service: Not on file    Active member of club or organization: Not on file    Attends meetings of clubs or organizations: Not on file    Relationship status: Not on file  Other Topics Concern  . Not on file  Social History Narrative  . Not on file    Outpatient Encounter Medications as of 07/12/2018  Medication Sig  . amLODipine (NORVASC) 10 MG tablet Take 10 mg by mouth daily.  . cetirizine (ZYRTEC) 10 MG  tablet Take 10 mg by mouth daily.  . Cholecalciferol (VITAMIN D3 PO) Take by mouth.  . Flaxseed, Linseed, (FLAXSEED OIL PO) Take by mouth.  . olmesartan-hydrochlorothiazide (BENICAR HCT) 40-25 MG tablet Take 1 tablet by mouth daily.  . pravastatin (PRAVACHOL) 20 MG tablet Take 20 mg by mouth daily.   No facility-administered encounter medications on file as of 07/12/2018.     Activities of Daily Living In your present state of health, do you have any difficulty performing the following activities: 07/12/2018  Hearing? Y  Vision? N  Difficulty concentrating or making decisions? N  Walking or climbing stairs? N  Dressing or bathing? N  Doing errands, shopping? N  Preparing Food and eating ? N  Using the Toilet? N  In the past six months, have you accidently leaked urine? N  Do you have problems with loss of bowel control? N  Managing your Medications? N  Managing your Finances? N  Housekeeping or managing your Housekeeping? N  Some recent data might be hidden   Physical Exam  Constitutional: She is oriented to person, place, and time and well-developed, well-nourished, and in no distress.  HENT:  Head: Normocephalic and atraumatic.  Eyes: Pupils are equal, round, and reactive to light. Conjunctivae and EOM are normal.  Neck: Normal range of motion.  Cardiovascular: Normal rate, regular rhythm and normal heart sounds.  Pulmonary/Chest: Effort normal and breath sounds normal.  Genitourinary:    Genitourinary Comments: deferred   Musculoskeletal: Normal range of motion.  Neurological: She is alert and oriented to person, place, and time. GCS score is 15.  Skin: Skin is warm.  Psychiatric: Affect normal.  Nursing note and vitals reviewed.   Patient Care Team: Glendale Chard, MD as PCP - General (Internal Medicine)    Assessment:   This is a routine wellness examination for Snigdha.  Exercise Activities and Dietary recommendations Current Exercise Habits: Home exercise  routine, Type of exercise: yoga;walking;stretching, Time (Minutes): 60, Frequency (Times/Week): 7, Weekly Exercise (Minutes/Week): 420, Intensity: Moderate  Goals    . DIET - INCREASE WATER INTAKE    . Increase physical activity       Fall Risk Fall Risk  07/12/2018 02/25/2018 03/23/2016  Falls in the past year? 0 No No  Comment - Emmi Telephone Survey: data to providers prior to load Emmi Telephone Survey: data to providers prior to load   Is the patient's home free of loose throw rugs in walkways, pet beds, electrical cords, etc?   yes      Grab bars in the bathroom? no      Handrails on the stairs?   yes      Adequate lighting?   yes  Timed Get Up and Go performed: she moves freely without assistive device.   Depression Screen  PHQ 2/9 Scores 07/12/2018  PHQ - 2 Score 0     Cognitive Function   Mini-Cog - 07/12/18 1438    Normal clock drawing test?  yes    How many words correct?  3       Immunization History  Administered Date(s) Administered  . DTaP 06/26/2018  . Influenza-Unspecified 06/27/2018  . Pneumococcal-Unspecified 05/04/2006, 11/20/2013    Qualifies for Shingles Vaccine? yes  Screening Tests Health Maintenance  Topic Date Due  . PNA vac Low Risk Adult (2 of 2 - PCV13) 11/21/2014  . TETANUS/TDAP  12/25/2019  . INFLUENZA VACCINE  Completed  . DEXA SCAN  Completed    Cancer Screenings: Lung: Low Dose CT Chest recommended if Age 36-80 years, 30 pack-year currently smoking OR have quit w/in 15years. Patient does not qualify. Breast:  Up to date on Mammogram? no   Up to date of Bone Density/Dexa? No, ordered today. Colorectal: yes  Additional Screenings:  Hepatitis C Screening:      Plan:  1. Hypertensive nephropathy  Uncontrolled. She is encouraged to avoid adding salt to her foods. She reports compliance with her meds. However, admits she did not take today. She admits to being under some stress. She will continue with current meds for now. She  will rto in six weeks for re-evaluation.   - CMP14+EGFR - POCT Urinalysis Dipstick (81002) - POCT UA - Microalbumin  2. Chronic renal disease, stage III (HCC)  Chronic. I will check a GFR, Cr today. She is encouraged to stay well hydrated.   3. Pure hypercholesterolemia  SHE IS ENCOURAGED TO EXERCISE FIVE DAYS WEEKLY FOR AT LEAST 30 MINUTES, AVOID FRIED FOODS, EAT 25-35 GRAMS OF FIBER, AND TO EAT FISH AT LEAST TWICE WEEKLY.  - Lipid Profile  4. Other abnormal glucose  HER A1C HAS BEEN ELEVATED IN THE PAST. I WILL CHECK AN A1C, BMET TODAY. SHE WAS ENCOURAGED TO AVOID SUGARY BEVERAGES AND PROCESSED FOODS INCLUDNG BREADS, RICE AND PASTA.  - Hemoglobin A1c  5. Body mass index (bmi) 28.0-28.9, adult  She is encouraged to exercises 30 minutes five days weekly. Her weight is stable for her age group.   6. Estrogen deficiency  [facility]. She is encouraged to engage in weight-bearing activity two to three days weekly and to continue with calcium/vitamin D supplementation.   - DG Bone Density; Future  7. Medicare annual wellness visit, subsequent  THE ANNUAL WELLNESS VISIT WAS PERFORMED INCLUDING DISCUSSION OF ADVANCED DIRECTIVES, ASSESSMENT OF COGNITIVE FUNCTION AND COMPLETION OF GERIATRIC FUNCTIONAL ASSESSMENT FORM.   I have personally reviewed and noted the following in the patient's chart:   . Medical and social history . Use of alcohol, tobacco or illicit drugs  . Current medications and supplements . Functional ability and status . Nutritional status . Physical activity . Advanced directives . List of other physicians . Hospitalizations, surgeries, and ER visits in previous 12 months . Vitals . Screenings to include cognitive, depression, and falls . Referrals and appointments  In addition, I have reviewed and discussed with patient certain preventive protocols, quality metrics, and best practice recommendations. A written  personalized care plan for preventive services as well as general preventive health recommendations were provided to patient.     Maximino Greenland, MD  07/31/2018

## 2018-08-12 ENCOUNTER — Other Ambulatory Visit: Payer: Medicare Other

## 2018-08-12 DIAGNOSIS — R945 Abnormal results of liver function studies: Secondary | ICD-10-CM | POA: Diagnosis not present

## 2018-08-12 DIAGNOSIS — R7989 Other specified abnormal findings of blood chemistry: Secondary | ICD-10-CM

## 2018-08-13 LAB — HEPATIC FUNCTION PANEL
ALT: 43 IU/L — ABNORMAL HIGH (ref 0–32)
AST: 50 IU/L — ABNORMAL HIGH (ref 0–40)
Albumin: 4.6 g/dL (ref 3.5–4.7)
Alkaline Phosphatase: 112 IU/L (ref 39–117)
Bilirubin Total: 0.5 mg/dL (ref 0.0–1.2)
Bilirubin, Direct: 0.17 mg/dL (ref 0.00–0.40)
Total Protein: 7.1 g/dL (ref 6.0–8.5)

## 2018-08-29 ENCOUNTER — Other Ambulatory Visit: Payer: Self-pay | Admitting: Internal Medicine

## 2018-12-26 ENCOUNTER — Other Ambulatory Visit: Payer: Self-pay | Admitting: Internal Medicine

## 2019-01-03 ENCOUNTER — Other Ambulatory Visit: Payer: Self-pay | Admitting: Internal Medicine

## 2019-01-11 ENCOUNTER — Other Ambulatory Visit: Payer: Self-pay

## 2019-01-11 ENCOUNTER — Ambulatory Visit (INDEPENDENT_AMBULATORY_CARE_PROVIDER_SITE_OTHER): Payer: Medicare Other | Admitting: Internal Medicine

## 2019-01-11 ENCOUNTER — Encounter: Payer: Self-pay | Admitting: Internal Medicine

## 2019-01-11 VITALS — BP 142/66 | HR 102 | Temp 98.3°F | Ht 59.0 in | Wt 138.6 lb

## 2019-01-11 DIAGNOSIS — N183 Chronic kidney disease, stage 3 unspecified: Secondary | ICD-10-CM

## 2019-01-11 DIAGNOSIS — M816 Localized osteoporosis [Lequesne]: Secondary | ICD-10-CM | POA: Diagnosis not present

## 2019-01-11 DIAGNOSIS — I129 Hypertensive chronic kidney disease with stage 1 through stage 4 chronic kidney disease, or unspecified chronic kidney disease: Secondary | ICD-10-CM | POA: Diagnosis not present

## 2019-01-11 DIAGNOSIS — Z6827 Body mass index (BMI) 27.0-27.9, adult: Secondary | ICD-10-CM

## 2019-01-11 DIAGNOSIS — E7800 Pure hypercholesterolemia, unspecified: Secondary | ICD-10-CM | POA: Diagnosis not present

## 2019-01-11 DIAGNOSIS — K649 Unspecified hemorrhoids: Secondary | ICD-10-CM | POA: Diagnosis not present

## 2019-01-11 DIAGNOSIS — R000 Tachycardia, unspecified: Secondary | ICD-10-CM | POA: Diagnosis not present

## 2019-01-11 MED ORDER — HYDROCORTISONE (PERIANAL) 2.5 % EX CREA: 1.0000 "application " | TOPICAL_CREAM | Freq: Two times a day (BID) | CUTANEOUS | 0 refills | Status: AC

## 2019-01-11 NOTE — Patient Instructions (Signed)
Osteoporosis    Osteoporosis happens when your bones get thin and weak. This can cause your bones to break (fracture) more easily. You can do things at home to make your bones stronger.  Follow these instructions at home:    Activity   Exercise as told by your doctor. Ask your doctor what activities are safe for you. You should do:  ? Exercises that make your muscles work to hold your body weight up (weight-bearing exercises). These include tai chi, yoga, and walking.  ? Exercises to make your muscles stronger. One example is lifting weights.  Lifestyle   Limit alcohol intake to no more than 1 drink a day for nonpregnant women and 2 drinks a day for men. One drink equals 12 oz of beer, 5 oz of wine, or 1 oz of hard liquor.   Do not use any products that have nicotine or tobacco in them. These include cigarettes and e-cigarettes. If you need help quitting, ask your doctor.  Preventing falls   Use tools to help you move around (mobility aids) as needed. These include canes, walkers, scooters, and crutches.   Keep rooms well-lit and free of clutter.   Put away things that could make you trip. These include cords and rugs.   Install safety rails on stairs. Install grab bars in bathrooms.   Use rubber mats in slippery areas, like bathrooms.   Wear shoes that:  ? Fit you well.  ? Support your feet.  ? Have closed toes.  ? Have rubber soles or low heels.   Tell your doctor about all of the medicines you are taking. Some medicines can make you more likely to fall.  General instructions   Eat plenty of calcium and vitamin D. These nutrients are good for your bones. Good sources of calcium and vitamin D include:  ? Some fatty fish, such as salmon and tuna.  ? Foods that have calcium and vitamin D added to them (fortified foods). For example, some breakfast cereals are fortified with calcium and vitamin D.  ? Egg yolks.  ? Cheese.  ? Liver.   Take over-the-counter and prescription medicines only as told by your  doctor.   Keep all follow-up visits as told by your doctor. This is important.  Contact a doctor if:   You have not been tested (screened) for osteoporosis and you are:  ? A woman who is age 65 or older.  ? A man who is age 70 or older.  Get help right away if:   You fall.   You get hurt.  Summary   Osteoporosis happens when your bones get thin and weak.   Weak bones can break (fracture) more easily.   Eat plenty of calcium and vitamin D. These nutrients are good for your bones.   Tell your doctor about all of the medicines that you take.  This information is not intended to replace advice given to you by your health care provider. Make sure you discuss any questions you have with your health care provider.  Document Released: 10/05/2011 Document Revised: 05/07/2017 Document Reviewed: 05/07/2017  Elsevier Interactive Patient Education  2019 [employer] Inc[employer]

## 2019-01-12 LAB — CMP14+EGFR
ALT: 43 IU/L — ABNORMAL HIGH (ref 0–32)
AST: 48 IU/L — ABNORMAL HIGH (ref 0–40)
Albumin/Globulin Ratio: 1.7 (ref 1.2–2.2)
Albumin: 4.5 g/dL (ref 3.7–4.7)
Alkaline Phosphatase: 105 IU/L (ref 39–117)
BUN/Creatinine Ratio: 18 (ref 12–28)
BUN: 22 mg/dL (ref 8–27)
Bilirubin Total: 0.5 mg/dL (ref 0.0–1.2)
CO2: 20 mmol/L (ref 20–29)
Calcium: 10 mg/dL (ref 8.7–10.3)
Chloride: 99 mmol/L (ref 96–106)
Creatinine, Ser: 1.23 mg/dL — ABNORMAL HIGH (ref 0.57–1.00)
GFR calc Af Amer: 48 mL/min/{1.73_m2} — ABNORMAL LOW (ref >59)
GFR calc non Af Amer: 42 mL/min/{1.73_m2} — ABNORMAL LOW (ref >59)
Globulin, Total: 2.6 g/dL (ref 1.5–4.5)
Glucose: 121 mg/dL — ABNORMAL HIGH (ref 65–99)
Potassium: 4.4 mmol/L (ref 3.5–5.2)
Sodium: 135 mmol/L (ref 134–144)
Total Protein: 7.1 g/dL (ref 6.0–8.5)

## 2019-01-12 LAB — LIPID PANEL
Chol/HDL Ratio: 3.4 ratio (ref 0.0–4.4)
Cholesterol, Total: 182 mg/dL (ref 100–199)
HDL: 53 mg/dL (ref >39)
LDL Calculated: 103 mg/dL — ABNORMAL HIGH (ref 0–99)
Triglycerides: 131 mg/dL (ref 0–149)
VLDL Cholesterol Cal: 26 mg/dL (ref 5–40)

## 2019-01-15 NOTE — Progress Notes (Signed)
Subjective:     Patient ID: Amanda [name] , female    DOB: 02/14/1941 , 78 y.o.   MRN: [id number]   Chief Complaint  Patient presents with  . Hypertension  . Hyperlipidemia    HPI  Hypertension This is a chronic problem. The current episode started more than 1 year ago. The problem has been gradually improving since onset. The problem is uncontrolled. Pertinent negatives include no blurred vision, chest pain, palpitations or sweats. Risk factors for coronary artery disease include dyslipidemia, post-menopausal state and sedentary lifestyle. Identifiable causes of hypertension include chronic renal disease.  Hyperlipidemia This is a chronic problem. The current episode started more than 1 year ago. The problem is controlled. Recent lipid tests were reviewed and are high. Exacerbating diseases include chronic renal disease. Pertinent negatives include no chest pain.     Past Medical History:  Diagnosis Date  . CKD (chronic kidney disease) stage 3, GFR 30-59 ml/min (HCC)   . HTN (hypertension)   . Hyperlipidemia      Family History  Problem Relation Age of Onset  . Hypertension Mother   . Anuerysm Father   . Hypotension Father      Current Outpatient Medications:  .  amLODipine (NORVASC) 10 MG tablet, TAKE 1 TABLET BY MOUTH ONCE DAILY, Disp: 90 tablet, Rfl: 1 .  cetirizine (ZYRTEC) 10 MG tablet, Take 10 mg by mouth daily., Disp: , Rfl:  .  Cholecalciferol (VITAMIN D3 PO), Take 1,000 mg by mouth. , Disp: , Rfl:  .  Flaxseed, Linseed, (FLAXSEED OIL PO), Take by mouth., Disp: , Rfl:  .  olmesartan-hydrochlorothiazide (BENICAR HCT) 40-25 MG tablet, TAKE 1 TABLET BY MOUTH ONCE A DAY., Disp: 90 tablet, Rfl: 2 .  pravastatin (PRAVACHOL) 20 MG tablet, TAKE 1 TABLET BY MOUTH ONCE DAILY, Disp: 90 tablet, Rfl: 0 .  hydrocortisone (ANUSOL-HC) 2.5 % rectal cream, Place 1 application rectally 2 (two) times daily., Disp: 30 g, Rfl: 0   Allergies  Allergen Reactions  . Sulfur Hives and  Swelling     Review of Systems  Constitutional: Negative.   Eyes: Negative for blurred vision.  Respiratory: Negative.   Cardiovascular: Negative.  Negative for chest pain and palpitations.  Gastrointestinal: Negative.        Amanda [name] c/o hemorrhoids. First noticed them about a week ago. No relief with otc meds. No blood in stools.   Neurological: Negative.   Psychiatric/Behavioral: Negative.      Today's Vitals   01/11/19 0914  BP: (!) 142/66  Pulse: (!) 102  Temp: 98.3 F (36.8 C)  TempSrc: Oral  Weight: 138 lb 9.6 oz (62.9 kg)  Height: _0  (1.499 m)  PainSc: 0-No pain   Body mass index is 27.99 kg/m.   Objective:  Physical Exam Vitals signs and nursing note reviewed.  Constitutional:      Appearance: Normal appearance.  HENT:     Head: Normocephalic and atraumatic.  Cardiovascular:     Rate and Rhythm: Normal rate and regular rhythm.     Heart sounds: Normal heart sounds.  Pulmonary:     Effort: Pulmonary effort is normal.     Breath sounds: Normal breath sounds.  Skin:    General: Skin is warm.  Neurological:     General: No focal deficit present.     Mental Status: Amanda [name] is alert.  Psychiatric:        Mood and Affect: Mood normal.        Behavior: Behavior normal.  Assessment And Plan:     1. Hypertensive nephropathy  Fair control. Amanda [name] will continue with current meds for now.  I suggested that we change her meds; however, Amanda [name] declined.  Amanda [name] is also noted to be tachycardic. Amanda [name] is encouraged to increase her water intake. Amanda [name] is encouraged to avoid adding salt to her foods. Amanda [name] will rto in six months for her next AWV.   - CMP14+EGFR  2. Chronic renal disease, stage III (HCC)  Chronic. Amanda [name] is encouraged to stay well hydrated. I will check GFR, Cr today.   3. Pure hypercholesterolemia  Chronic. I will check lipid panel and LFTs today. Amanda [name] is encouraged to avoid fried foods and incorporate more activity into her daily routine.   - Lipid  panel  4. Localized osteoporosis without current pathological fracture  We reviewed her most recent dexa scan. Amanda [name] is due this year. Amanda [name] declines to have exam done at this time. Importance of engagement in weight bearing exercises, along with calcium and vitamin d supplementation was discussed with the patient.   5. Hemorrhoids, unspecified hemorrhoid type  Amanda [name] declined exam. Amanda [name] was given rx anusol to use prn. Amanda [name] will let me know if her sx persist.   6. Body mass index (BMI) of 27.0 to 27.9 in adult  Her weight is stable. Amanda [name] is encouraged to incorporate more exercise into her daily routine.   Maximino Greenland, MD    THE PATIENT IS ENCOURAGED TO PRACTICE SOCIAL DISTANCING DUE TO THE COVID-19 PANDEMIC.

## 2019-01-16 LAB — PROTEIN ELECTROPHORESIS
A/G Ratio: 1.1 (ref 0.7–1.7)
Albumin ELP: 3.9 g/dL (ref 2.9–4.4)
Alpha 1: 0.3 g/dL (ref 0.0–0.4)
Alpha 2: 1 g/dL (ref 0.4–1.0)
Beta: 1.2 g/dL (ref 0.7–1.3)
Gamma Globulin: 0.9 g/dL (ref 0.4–1.8)
Globulin, Total: 3.4 g/dL (ref 2.2–3.9)
Total Protein: 7.3 g/dL (ref 6.0–8.5)

## 2019-01-16 LAB — PHOSPHORUS: Phosphorus: 3.2 mg/dL (ref 3.0–4.3)

## 2019-01-16 LAB — SPECIMEN STATUS REPORT

## 2019-01-31 ENCOUNTER — Encounter: Payer: Self-pay | Admitting: Internal Medicine

## 2019-01-31 ENCOUNTER — Other Ambulatory Visit: Payer: Self-pay

## 2019-01-31 ENCOUNTER — Ambulatory Visit (INDEPENDENT_AMBULATORY_CARE_PROVIDER_SITE_OTHER): Payer: Medicare Other | Admitting: Internal Medicine

## 2019-01-31 VITALS — BP 148/72 | HR 103 | Temp 98.5°F | Ht 59.0 in | Wt 139.6 lb

## 2019-01-31 DIAGNOSIS — I129 Hypertensive chronic kidney disease with stage 1 through stage 4 chronic kidney disease, or unspecified chronic kidney disease: Secondary | ICD-10-CM | POA: Diagnosis not present

## 2019-01-31 DIAGNOSIS — N183 Chronic kidney disease, stage 3 unspecified: Secondary | ICD-10-CM

## 2019-01-31 DIAGNOSIS — L309 Dermatitis, unspecified: Secondary | ICD-10-CM

## 2019-01-31 DIAGNOSIS — L304 Erythema intertrigo: Secondary | ICD-10-CM

## 2019-01-31 MED ORDER — TRIAMCINOLONE ACETONIDE 0.1 % EX CREA: 1.0000 "application " | TOPICAL_CREAM | Freq: Two times a day (BID) | CUTANEOUS | 0 refills | Status: DC

## 2019-01-31 MED ORDER — FLUCONAZOLE 100 MG PO TABS: 100.0000 mg | ORAL_TABLET | Freq: Every day | ORAL | 0 refills | Status: DC

## 2019-01-31 NOTE — Patient Instructions (Signed)
Intertrigo Intertrigo is skin irritation (inflammation) that happens in warm, moist areas of the body. The irritation can cause a rash and make skin raw and itchy. The rash is usually pink or red. It happens mostly between folds of skin or where skin rubs together, such as:  Between the toes.  In the armpits.  In the groin area.  Under the belly.  Under the breasts.  Around the butt area. This condition is not passed from person to person (is not contagious). What are the causes?  Heat, moisture, rubbing, and not enough air movement.  The condition can be made worse by: ? Sweat. ? Bacteria. ? A fungus, such as yeast. What increases the risk?  Moisture in your skin folds.  You are more likely to develop this condition if you: ? Have diabetes. ? Are overweight. ? Are not able to move around. ? Live in a warm and moist climate. ? Wear splints, braces, or other medical devices. ? Are not able to control your pee (urine) or poop (stool). What are the signs or symptoms?  A pink or red skin rash in the skin fold or near the skin fold.  Raw or scaly skin.  Itching.  A burning feeling.  Bleeding.  Leaking fluid.  A bad smell. How is this treated?  Cleaning and drying your skin.  Taking an antibiotic medicine or using an antibiotic skin cream for a bacterial infection.  Using an antifungal cream on your skin or taking pills for an infection that was caused by a fungus, such as yeast.  Using a steroid ointment to stop the itching and irritation.  Separating the skin fold with a clean cotton cloth to absorb moisture and allow air to flow into the area. Follow these instructions at home:  Keep the affected area clean and dry.  Do not scratch your skin.  Stay cool as much as you can. Use an air conditioner or a fan, if you have one.  Apply over-the-counter and prescription medicines only as told by your doctor.  If you were prescribed an antibiotic medicine,  use it as told by your doctor. Do not stop using the antibiotic even if your condition starts to get better.  Keep all follow-up visits as told by your doctor. This is important. How is this prevented?   Stay at a healthy weight.  Take care of your feet. This is very important if you have diabetes. You should: ? Wear shoes that fit well. ? Keep your feet dry. ? Wear clean cotton or wool socks.  Protect the skin in your groin and butt area as told by your doctor. To do this: ? Follow a regular cleaning routine. ? Use creams, powders, or ointments that protect your skin. ? Change protection pads often.  Do not wear tight clothes. Wear clothes that: ? Are loose. ? Take moisture away from your body. ? Are made of cotton.  Wear a bra that gives good support, if needed.  Shower and dry yourself well after being active. Use a hair dryer on a cool setting to dry between skin folds.  Keep your blood sugar under control if you have diabetes. Contact a doctor if:  Your symptoms do not get better with treatment.  Your symptoms get worse or they spread.  You notice more redness and warmth.  You have a fever. Summary  Intertrigo is skin irritation that occurs when folds of skin rub together.  This condition is caused by heat, moisture,   and rubbing.  This condition may be treated by cleaning and drying your skin and with medicines.  Apply over-the-counter and prescription medicines only as told by your doctor.  Keep all follow-up visits as told by your doctor. This is important. This information is not intended to replace advice given to you by your health care provider. Make sure you discuss any questions you have with your health care provider. Document Released: 08/15/2010 Document Revised: 04/21/2018 Document Reviewed: 04/21/2018 Elsevier Patient Education  2020 [employer]  

## 2019-02-05 NOTE — Progress Notes (Signed)
Subjective:     Patient ID: Amanda [name] , female    DOB: 05/11/1941 , 78 y.o.   MRN: [id number]   Chief Complaint  Patient presents with  . Rash    HPI  Rash This is a new problem. The current episode started in the past 7 days. The problem has been gradually worsening since onset. The affected locations include the groin, right upper leg and right arm. The rash is characterized by itchiness. She was exposed to nothing. Pertinent negatives include no fatigue, joint pain, shortness of breath or sore throat. Past treatments include cold compress and anti-itch cream. The treatment provided no relief.     Past Medical History:  Diagnosis Date  . CKD (chronic kidney disease) stage 3, GFR 30-59 ml/min (HCC)   . HTN (hypertension)   . Hyperlipidemia      Family History  Problem Relation Age of Onset  . Hypertension Mother   . Anuerysm Father   . Hypotension Father      Current Outpatient Medications:  .  amLODipine (NORVASC) 10 MG tablet, TAKE 1 TABLET BY MOUTH ONCE DAILY, Disp: 90 tablet, Rfl: 1 .  cetirizine (ZYRTEC) 10 MG tablet, Take 10 mg by mouth daily., Disp: , Rfl:  .  Cholecalciferol (VITAMIN D3 PO), Take 1,000 mg by mouth. , Disp: , Rfl:  .  Flaxseed, Linseed, (FLAXSEED OIL PO), Take by mouth., Disp: , Rfl:  .  hydrocortisone (ANUSOL-HC) 2.5 % rectal cream, Place 1 application rectally 2 (two) times daily., Disp: 30 g, Rfl: 0 .  olmesartan-hydrochlorothiazide (BENICAR HCT) 40-25 MG tablet, TAKE 1 TABLET BY MOUTH ONCE A DAY., Disp: 90 tablet, Rfl: 2 .  pravastatin (PRAVACHOL) 20 MG tablet, TAKE 1 TABLET BY MOUTH ONCE DAILY, Disp: 90 tablet, Rfl: 0 .  fluconazole (DIFLUCAN) 100 MG tablet, Take 1 tablet (100 mg total) by mouth daily for 7 days., Disp: 7 tablet, Rfl: 0 .  triamcinolone cream (KENALOG) 0.1 %, Apply 1 application topically 2 (two) times daily., Disp: 30 g, Rfl: 0   Allergies  Allergen Reactions  . Sulfur Hives and Swelling     Review of Systems   Constitutional: Negative.  Negative for fatigue.  HENT: Negative for sore throat.   Respiratory: Negative.  Negative for shortness of breath.   Cardiovascular: Negative.   Gastrointestinal: Negative.   Musculoskeletal: Negative for joint pain.  Skin: Positive for rash.  Neurological: Negative.   Psychiatric/Behavioral: Negative.      Today's Vitals   01/31/19 1454  BP: (!) 148/72  Pulse: (!) 103  Temp: 98.5 F (36.9 C)  TempSrc: Oral  Weight: 139 lb 9.6 oz (63.3 kg)  Height: 4\' 11"  (1.499 m)  PainSc: 0-No pain   Body mass index is 28.2 kg/m.   Objective:  Physical Exam Vitals signs and nursing note reviewed.  Constitutional:      Appearance: Normal appearance.  HENT:     Head: Normocephalic and atraumatic.  Cardiovascular:     Rate and Rhythm: Normal rate and regular rhythm.     Heart sounds: Normal heart sounds.  Pulmonary:     Effort: Pulmonary effort is normal.     Breath sounds: Normal breath sounds.  Genitourinary:    Exam position: Supine.     Pubic Area: Rash present.     Comments: Erythematous rash b/l groin Skin:    General: Skin is warm.     Comments: Areas of scaly/sl.erythematous patches on RUE and RLE. No vesicular lesions noted.  Neurological:     General: No focal deficit present.     Mental Status: She is alert.  Psychiatric:        Mood and Affect: Mood normal.        Behavior: Behavior normal.         Assessment And Plan:     1. Dermatitis  She was given rx triamcinolone cream to affected area twice daily as needed.   2. Intertrigo  She was given rx diflucan 100mg  to take one tab every 2-3 days. She was advised to stop taking her cholesterol medication while on the diflucan. Pt advised that she may stop the medication once her rash clears up.   3. Hypertensive nephropathy  Fair control. She will continue with current meds. She does not agree to any medication changes at this time.   4. Chronic renal disease, stage III  (HCC)  Chronic. Last GFR reviewed, which is stable.         Maximino Greenland, MD    THE PATIENT IS ENCOURAGED TO PRACTICE SOCIAL DISTANCING DUE TO THE COVID-19 PANDEMIC.

## 2019-02-06 ENCOUNTER — Other Ambulatory Visit: Payer: Self-pay

## 2019-02-06 MED ORDER — FLUCONAZOLE 100 MG PO TABS: 100.0000 mg | ORAL_TABLET | Freq: Every day | ORAL | 0 refills | Status: AC

## 2019-02-07 ENCOUNTER — Telehealth: Payer: Self-pay

## 2019-02-07 NOTE — Telephone Encounter (Signed)
error 

## 2019-04-04 ENCOUNTER — Other Ambulatory Visit: Payer: Self-pay

## 2019-04-04 MED ORDER — PRAVASTATIN SODIUM 20 MG PO TABS: 20.0000 mg | ORAL_TABLET | Freq: Every day | ORAL | 2 refills | Status: DC

## 2019-05-24 ENCOUNTER — Other Ambulatory Visit: Payer: Self-pay | Admitting: Internal Medicine

## 2019-05-25 DIAGNOSIS — Z23 Encounter for immunization: Secondary | ICD-10-CM | POA: Diagnosis not present

## 2019-05-30 ENCOUNTER — Encounter: Payer: Self-pay | Admitting: Internal Medicine

## 2019-06-23 DIAGNOSIS — R05 Cough: Secondary | ICD-10-CM | POA: Diagnosis not present

## 2019-06-23 DIAGNOSIS — Z20828 Contact with and (suspected) exposure to other viral communicable diseases: Secondary | ICD-10-CM | POA: Diagnosis not present

## 2019-07-19 ENCOUNTER — Ambulatory Visit: Payer: Medicare Other | Admitting: Internal Medicine

## 2019-07-19 ENCOUNTER — Ambulatory Visit: Payer: Medicare Other

## 2019-07-19 ENCOUNTER — Telehealth: Payer: Self-pay

## 2019-07-19 NOTE — Telephone Encounter (Signed)
This nurse called patient in order to follow up due to no show for today's AWV. She stated that she had cancelled the appointment due to herself and her whole family having covid.

## 2019-08-16 ENCOUNTER — Other Ambulatory Visit: Payer: Self-pay

## 2019-08-16 ENCOUNTER — Ambulatory Visit (INDEPENDENT_AMBULATORY_CARE_PROVIDER_SITE_OTHER): Payer: Medicare Other

## 2019-08-16 ENCOUNTER — Encounter: Payer: Self-pay | Admitting: Internal Medicine

## 2019-08-16 ENCOUNTER — Ambulatory Visit (INDEPENDENT_AMBULATORY_CARE_PROVIDER_SITE_OTHER): Payer: Medicare Other | Admitting: Internal Medicine

## 2019-08-16 VITALS — BP 144/60 | HR 96 | Temp 97.9°F | Ht 59.0 in | Wt 133.8 lb

## 2019-08-16 VITALS — BP 152/68 | HR 96 | Temp 97.9°F | Ht 59.0 in | Wt 133.0 lb

## 2019-08-16 DIAGNOSIS — Z79899 Other long term (current) drug therapy: Secondary | ICD-10-CM

## 2019-08-16 DIAGNOSIS — Z8616 Personal history of COVID-19: Secondary | ICD-10-CM | POA: Diagnosis not present

## 2019-08-16 DIAGNOSIS — Z6826 Body mass index (BMI) 26.0-26.9, adult: Secondary | ICD-10-CM

## 2019-08-16 DIAGNOSIS — E663 Overweight: Secondary | ICD-10-CM | POA: Diagnosis not present

## 2019-08-16 DIAGNOSIS — I129 Hypertensive chronic kidney disease with stage 1 through stage 4 chronic kidney disease, or unspecified chronic kidney disease: Secondary | ICD-10-CM | POA: Diagnosis not present

## 2019-08-16 DIAGNOSIS — E7800 Pure hypercholesterolemia, unspecified: Secondary | ICD-10-CM | POA: Diagnosis not present

## 2019-08-16 DIAGNOSIS — N183 Chronic kidney disease, stage 3 unspecified: Secondary | ICD-10-CM

## 2019-08-16 DIAGNOSIS — Z0000 Encounter for general adult medical examination without abnormal findings: Secondary | ICD-10-CM

## 2019-08-16 MED ORDER — OLMESARTAN MEDOXOMIL-HCTZ 40-25 MG PO TABS: 1.0000 | ORAL_TABLET | Freq: Every day | ORAL | 2 refills | Status: DC

## 2019-08-16 NOTE — Patient Instructions (Signed)
Amanda [name] , Thank you for taking time to come for your Medicare Wellness Visit. I appreciate your ongoing commitment to your health goals. Please review the following plan we discussed and let me know if I can assist you in the future.   Screening recommendations/referrals: Colonoscopy: not required Mammogram: not required Bone Density: 01/2015 Recommended yearly ophthalmology/optometry visit for glaucoma screening and checkup Recommended yearly dental visit for hygiene and checkup  Vaccinations: Influenza vaccine: 04/2019 Pneumococcal vaccine: 10/2013 Tdap vaccine: 11/2009 Shingles vaccine: discussed    Advanced directives: Please bring a copy of your POA (Power of [place]) and/or Living Will to your next appointment.   Conditions/risks identified: overweight  Next appointment:    Preventive Care 65 Years and Older, Female Preventive care refers to lifestyle choices and visits with your health care provider that can promote health and wellness. What does preventive care include?  A yearly physical exam. This is also called an annual well check.  Dental exams once or twice a year.  Routine eye exams. Ask your health care provider how often you should have your eyes checked.  Personal lifestyle choices, including:  Daily care of your teeth and gums.  Regular physical activity.  Eating a healthy diet.  Avoiding tobacco and drug use.  Limiting alcohol use.  Practicing safe sex.  Taking low-dose aspirin every day.  Taking vitamin and mineral supplements as recommended by your health care provider. What happens during an annual well check? The services and screenings done by your health care provider during your annual well check will depend on your age, overall health, lifestyle risk factors, and family history of disease. Counseling  Your health care provider may ask you questions about your:  Alcohol use.  Tobacco use.  Drug use.  Emotional  well-being.  Home and relationship well-being.  Sexual activity.  Eating habits.  History of falls.  Memory and ability to understand (cognition).  Work and work Astronomer.  Reproductive health. Screening  You may have the following tests or measurements:  Height, weight, and BMI.  Blood pressure.  Lipid and cholesterol levels. These may be checked every 5 years, or more frequently if you are over 69 years old.  Skin check.  Lung cancer screening. You may have this screening every year starting at age 34 if you have a 30-pack-year history of smoking and currently smoke or have quit within the past 15 years.  Fecal occult blood test (FOBT) of the stool. You may have this test every year starting at age 65.  Flexible sigmoidoscopy or colonoscopy. You may have a sigmoidoscopy every 5 years or a colonoscopy every 10 years starting at age 68.  Hepatitis C blood test.  Hepatitis B blood test.  Sexually transmitted disease (STD) testing.  Diabetes screening. This is done by checking your blood sugar (glucose) after you have not eaten for a while (fasting). You may have this done every 1-3 years.  Bone density scan. This is done to screen for osteoporosis. You may have this done starting at age 20.  Mammogram. This may be done every 1-2 years. Talk to your health care provider about how often you should have regular mammograms. Talk with your health care provider about your test results, treatment options, and if necessary, the need for more tests. Vaccines  Your health care provider may recommend certain vaccines, such as:  Influenza vaccine. This is recommended every year.  Tetanus, diphtheria, and acellular pertussis (Tdap, Td) vaccine. You may need a Td booster every  10 years.  Zoster vaccine. You may need this after age 50.  Pneumococcal 13-valent conjugate (PCV13) vaccine. One dose is recommended after age 75.  Pneumococcal polysaccharide (PPSV23) vaccine. One  dose is recommended after age 72. Talk to your health care provider about which screenings and vaccines you need and how often you need them. This information is not intended to replace advice given to you by your health care provider. Make sure you discuss any questions you have with your health care provider. Document Released: 08/09/2015 Document Revised: 04/01/2016 Document Reviewed: 05/14/2015 Elsevier Interactive Patient Education  2017 [place] Prevention in the Home Falls can cause injuries. They can happen to people of all ages. There are many things you can do to make your home safe and to help prevent falls. What can I do on the outside of my home?  Regularly fix the edges of walkways and driveways and fix any cracks.  Remove anything that might make you trip as you walk through a door, such as a raised step or threshold.  Trim any bushes or trees on the path to your home.  Use bright outdoor lighting.  Clear any walking paths of anything that might make someone trip, such as rocks or tools.  Regularly check to see if handrails are loose or broken. Make sure that both sides of any steps have handrails.  Any raised decks and porches should have guardrails on the edges.  Have any leaves, snow, or ice cleared regularly.  Use sand or salt on walking paths during winter.  Clean up any spills in your garage right away. This includes oil or grease spills. What can I do in the bathroom?  Use night lights.  Install grab bars by the toilet and in the tub and shower. Do not use towel bars as grab bars.  Use non-skid mats or decals in the tub or shower.  If you need to sit down in the shower, use a plastic, non-slip stool.  Keep the floor dry. Clean up any water that spills on the floor as soon as it happens.  Remove soap buildup in the tub or shower regularly.  Attach bath mats securely with double-sided non-slip rug tape.  Do not have throw rugs and other  things on the floor that can make you trip. What can I do in the bedroom?  Use night lights.  Make sure that you have a light by your bed that is easy to reach.  Do not use any sheets or blankets that are too big for your bed. They should not hang down onto the floor.  Have a firm chair that has side arms. You can use this for support while you get dressed.  Do not have throw rugs and other things on the floor that can make you trip. What can I do in the kitchen?  Clean up any spills right away.  Avoid walking on wet floors.  Keep items that you use a lot in easy-to-reach places.  If you need to reach something above you, use a strong step stool that has a grab bar.  Keep electrical cords out of the way.  Do not use floor polish or wax that makes floors slippery. If you must use wax, use non-skid floor wax.  Do not have throw rugs and other things on the floor that can make you trip. What can I do with my stairs?  Do not leave any items on the stairs.  Make sure  that there are handrails on both sides of the stairs and use them. Fix handrails that are broken or loose. Make sure that handrails are as long as the stairways.  Check any carpeting to make sure that it is firmly attached to the stairs. Fix any carpet that is loose or worn.  Avoid having throw rugs at the top or bottom of the stairs. If you do have throw rugs, attach them to the floor with carpet tape.  Make sure that you have a light switch at the top of the stairs and the bottom of the stairs. If you do not have them, ask someone to add them for you. What else can I do to help prevent falls?  Wear shoes that:  Do not have high heels.  Have rubber bottoms.  Are comfortable and fit you well.  Are closed at the toe. Do not wear sandals.  If you use a stepladder:  Make sure that it is fully opened. Do not climb a closed stepladder.  Make sure that both sides of the stepladder are locked into place.  Ask  someone to hold it for you, if possible.  Clearly mark and make sure that you can see:  Any grab bars or handrails.  First and last steps.  Where the edge of each step is.  Use tools that help you move around (mobility aids) if they are needed. These include:  Canes.  Walkers.  Scooters.  Crutches.  Turn on the lights when you go into a dark area. Replace any light bulbs as soon as they burn out.  Set up your furniture so you have a clear path. Avoid moving your furniture around.  If any of your floors are uneven, fix them.  If there are any pets around you, be aware of where they are.  Review your medicines with your doctor. Some medicines can make you feel dizzy. This can increase your chance of falling. Ask your doctor what other things that you can do to help prevent falls. This information is not intended to replace advice given to you by your health care provider. Make sure you discuss any questions you have with your health care provider. Document Released: 05/09/2009 Document Revised: 12/19/2015 Document Reviewed: 08/17/2014 Elsevier Interactive Patient Education  2017 [employer].

## 2019-08-16 NOTE — Patient Instructions (Signed)

## 2019-08-16 NOTE — Progress Notes (Signed)
This visit occurred during the SARS-CoV-2 public health emergency.  Safety protocols were in place, including screening questions prior to the visit, additional usage of staff PPE, and extensive cleaning of exam room while observing appropriate contact time as indicated for disinfecting solutions.  Subjective:     Patient ID: Amanda [name] , female    DOB: July 25, 1941 , 79 y.o.   MRN: [id number]   Chief Complaint  Patient presents with  . Hypertension  . Hyperlipidemia    HPI  She is here today for bp/chol check. She reports compliance with meds. She is not surprised her bp is elevated today, b/c it is "always elevated at doctor's offices".  She reports her BP readings at home are between 115-130/70-80s. She denies headaches, chest pain and shortness of breath. She does not have her home BP cuff here today. She adds that she had COVID about a month ago and hasn't been exercising much. She reports she is finally feeling better and plans to resume her exercise routine in the near future.   Hypertension This is a chronic problem. The current episode started more than 1 year ago. The problem has been gradually improving since onset. The problem is uncontrolled. Pertinent negatives include no blurred vision, chest pain, palpitations or sweats. Risk factors for coronary artery disease include dyslipidemia, post-menopausal state and sedentary lifestyle. Identifiable causes of hypertension include chronic renal disease.  Hyperlipidemia This is a chronic problem. The current episode started more than 1 year ago. The problem is controlled. Recent lipid tests were reviewed and are high. Exacerbating diseases include chronic renal disease. Pertinent negatives include no chest pain. Current antihyperlipidemic treatment includes statins. The current treatment provides moderate improvement of lipids. Compliance problems include adherence to exercise.      Past Medical History:  Diagnosis Date  . CKD  (chronic kidney disease) stage 3, GFR 30-59 ml/min   . HTN (hypertension)   . Hyperlipidemia      Family History  Problem Relation Age of Onset  . Hypertension Mother   . Anuerysm Father   . Hypotension Father      Current Outpatient Medications:  .  amLODipine (NORVASC) 10 MG tablet, TAKE 1 TABLET BY MOUTH ONCE DAILY, Disp: 90 tablet, Rfl: 1 .  cetirizine (ZYRTEC) 10 MG tablet, Take 10 mg by mouth daily., Disp: , Rfl:  .  Cholecalciferol (VITAMIN D3 PO), Take 1,000 mg by mouth. , Disp: , Rfl:  .  Flaxseed, Linseed, (FLAXSEED OIL PO), Take by mouth., Disp: , Rfl:  .  hydrocortisone (ANUSOL-HC) 2.5 % rectal cream, Place 1 application rectally 2 (two) times daily., Disp: 30 g, Rfl: 0 .  olmesartan-hydrochlorothiazide (BENICAR HCT) 40-25 MG tablet, Take 1 tablet by mouth daily., Disp: 90 tablet, Rfl: 2 .  pravastatin (PRAVACHOL) 20 MG tablet, Take 1 tablet (20 mg total) by mouth daily., Disp: 90 tablet, Rfl: 2   Allergies  Allergen Reactions  . Sulfur Hives and Swelling     Review of Systems  Constitutional: Negative.   Eyes: Negative for blurred vision.  Respiratory: Negative.   Cardiovascular: Negative.  Negative for chest pain and palpitations.  Gastrointestinal: Negative.   Neurological: Negative.   Psychiatric/Behavioral: Negative.      Today's Vitals   08/16/19 1156  BP: (!) 144/60  Pulse: 96  Temp: 97.9 F (36.6 C)  TempSrc: Oral  Weight: 133 lb 12.8 oz (60.7 kg)  Height: '4\' 11"'$  (1.499 m)  PainSc: 0-No pain   Body mass index is 27.02  kg/m.   Objective:  Physical Exam Vitals and nursing note reviewed.  Constitutional:      Appearance: Normal appearance.  HENT:     Head: Normocephalic and atraumatic.  Cardiovascular:     Rate and Rhythm: Normal rate and regular rhythm.     Heart sounds: Normal heart sounds.  Pulmonary:     Effort: Pulmonary effort is normal.     Breath sounds: Normal breath sounds.  Skin:    General: Skin is warm.  Neurological:      General: No focal deficit present.     Mental Status: She is alert.  Psychiatric:        Mood and Affect: Mood normal.        Behavior: Behavior normal.         Assessment And Plan:     1. Hypertensive nephropathy  Chronic, well controlled. She will continue with current meds for now. She is encouraged to avoid adding salt to her foods. I will check her renal function today. She will consider returning for a nurse visit for BP check in the near future, she agrees to bring her cuff at that time.   - CMP14+EGFR  2. Stage 3 chronic kidney disease, unspecified whether stage 3a or 3b CKD  Chronic, this has been stable. I will check renal function today. She is encouraged to stay well hydrated. She is reminded CKD is the reason her BP needs to be less than 130/80 on a regular basis, even here at this office.   3. Pure hypercholesterolemia  Chronic, I will check non-fasting lipid panel and LFTs today. She is encouraged to  - Lipid panel  4. Drug therapy   5. Overweight with body mass index (BMI) of 26 to 26.9 in adult  She is at a healthy weight for her demographic. She is encouraged to resume her regular exercise routine as tolerated.   6. Personal history of covid-19    Maximino Greenland, MD    THE PATIENT IS ENCOURAGED TO PRACTICE SOCIAL DISTANCING DUE TO THE COVID-19 PANDEMIC.

## 2019-08-16 NOTE — Progress Notes (Signed)
This visit occurred during the SARS-CoV-2 public health emergency.  Safety protocols were in place, including screening questions prior to the visit, additional usage of staff PPE, and extensive cleaning of exam room while observing appropriate contact time as indicated for disinfecting solutions.  Subjective:   Amanda [name] is a 79 y.o. female who presents for Medicare Annual (Subsequent) preventive examination.  Review of Systems:  n/a Cardiac Risk Factors include: hypertension;sedentary lifestyle     Objective:     Vitals: BP (!) 152/68 (BP Location: Left Arm)   Pulse 96   Temp 97.9 F (36.6 C) (Oral)   Ht 4\' 11"  (1.499 m)   Wt 133 lb (60.3 kg)   BMI 26.86 kg/m   Body mass index is 26.86 kg/m.  Advanced Directives 08/16/2019  Does Patient Have a Medical Advance Directive? Yes  Type of 08/18/2019 of [place];Living will  Copy of Healthcare Power of Attorney in Chart? No - copy requested    Tobacco Social History   Tobacco Use  Smoking Status Never Smoker  Smokeless Tobacco Never Used     Counseling given: Not Answered   Clinical Intake:  Pre-visit preparation completed: Yes  Pain : No/denies pain     Nutritional Status: BMI 25 -29 Overweight Nutritional Risks: None Diabetes: No  How often do you need to have someone help you when you read instructions, pamphlets, or other written materials from your doctor or pharmacy?: 1 - Never What is the last grade level you completed in school?: 12th grade  Interpreter Needed?: No  Information entered by :: NAllen LPN  Past Medical History:  Diagnosis Date  . CKD (chronic kidney disease) stage 3, GFR 30-59 ml/min   . HTN (hypertension)   . Hyperlipidemia    History reviewed. No pertinent surgical history. Family History  Problem Relation Age of Onset  . Hypertension Mother   . Anuerysm Father   . Hypotension Father    Social History   Socioeconomic History  . Marital  status: Married    Spouse name: Not on file  . Number of children: Not on file  . Years of education: Not on file  . Highest education level: Not on file  Occupational History  . Occupation: retired  Tobacco Use  . Smoking status: Never Smoker  . Smokeless tobacco: Never Used  Substance and Sexual Activity  . Alcohol use: Never  . Drug use: Never  . Sexual activity: Not Currently  Other Topics Concern  . Not on file  Social History Narrative  . Not on file   Social Determinants of Health   Financial Resource Strain: Low Risk   . Difficulty of Paying Living Expenses: Not hard at all  Food Insecurity: No Food Insecurity  . Worried About [phone] in the Last Year: Never true  . Ran Out of Food in the Last Year: Never true  Transportation Needs: No Transportation Needs  . Lack of Transportation (Medical): No  . Lack of Transportation (Non-Medical): No  Physical Activity: Inactive  . Days of Exercise per Week: 0 days  . Minutes of Exercise per Session: 0 min  Stress: No Stress Concern Present  . Feeling of Stress : Not at all  Social Connections:   . Frequency of Communication with Friends and Family: Not on file  . Frequency of Social Gatherings with Friends and Family: Not on file  . Attends Religious Services: Not on file  . Active Member of Clubs or Organizations:  Not on file  . Attends Banker Meetings: Not on file  . Marital Status: Not on file    Outpatient Encounter Medications as of 08/16/2019  Medication Sig  . amLODipine (NORVASC) 10 MG tablet TAKE 1 TABLET BY MOUTH ONCE DAILY  . cetirizine (ZYRTEC) 10 MG tablet Take 10 mg by mouth daily.  . Cholecalciferol (VITAMIN D3 PO) Take 1,000 mg by mouth.   . Flaxseed, Linseed, (FLAXSEED OIL PO) Take by mouth.  . hydrocortisone (ANUSOL-HC) 2.5 % rectal cream Place 1 application rectally 2 (two) times daily.  Marland Kitchen olmesartan-hydrochlorothiazide (BENICAR HCT) 40-25 MG tablet Take 1 tablet by mouth  daily.  . pravastatin (PRAVACHOL) 20 MG tablet Take 1 tablet (20 mg total) by mouth daily.   No facility-administered encounter medications on file as of 08/16/2019.    Activities of Daily Living In your present state of health, do you have any difficulty performing the following activities: 08/16/2019 08/16/2019  Hearing? N Y  Vision? N N  Difficulty concentrating or making decisions? N N  Walking or climbing stairs? N N  Dressing or bathing? N N  Doing errands, shopping? N N  Preparing Food and eating ? N -  Using the Toilet? N -  In the past six months, have you accidently leaked urine? N -  Do you have problems with loss of bowel control? N -  Managing your Medications? N -  Managing your Finances? N -  Housekeeping or managing your Housekeeping? N -  Some recent data might be hidden    Patient Care Team: Dorothyann Peng, MD as PCP - General (Internal Medicine)    Assessment:   This is a routine wellness examination for Amanda [name].  Exercise Activities and Dietary recommendations Current Exercise Habits: The patient does not participate in regular exercise at present  Goals    . DIET - INCREASE WATER INTAKE    . Increase physical activity    . Weight (lb) < 200 lb (90.7 kg)     08/16/2019, wants to get get down to 125 pounds       Fall Risk Fall Risk  08/16/2019 01/31/2019 01/11/2019 07/12/2018 02/25/2018  Falls in the past year? 0 0 0 0 No  Comment - - - - Emmi Telephone Survey: data to providers prior to load  Risk for fall due to : Medication side effect - - - -  Follow up Falls evaluation completed;Education provided;Falls prevention discussed - - - -   Is the patient's home free of loose throw rugs in walkways, pet beds, electrical cords, etc?   yes      Grab bars in the bathroom? yes      Handrails on the stairs?   yes      Adequate lighting?   yes  Timed Get Up and Go performed: n/a  Depression Screen PHQ 2/9 Scores 08/16/2019 01/11/2019 07/12/2018  PHQ - 2 Score  0 0 0  PHQ- 9 Score 0 - -     Cognitive Function     6CIT Screen 08/16/2019  What Year? 0 points  What month? 0 points  What time? 0 points  Count back from 20 0 points  Months in reverse 0 points  Repeat phrase 0 points  Total Score 0    Immunization History  Administered Date(s) Administered  . DTaP 06/26/2018  . Influenza-Unspecified 06/27/2018, 05/25/2019  . Pneumococcal-Unspecified 05/04/2006, 11/20/2013    Qualifies for Shingles Vaccine? yes  Screening Tests Health Maintenance  Topic Date Due  .  PNA vac Low Risk Adult (2 of 2 - PCV13) 11/21/2014  . TETANUS/TDAP  12/25/2019  . INFLUENZA VACCINE  Completed  . DEXA SCAN  Completed    Cancer Screenings: Lung: Low Dose CT Chest recommended if Age 79-80 years, 30 pack-year currently smoking OR have quit w/in 15years. Patient does not qualify. Breast:  Up to date on Mammogram? Yes   Up to date of Bone Density/Dexa? Yes Colorectal: not required  Additional Screenings: : Hepatitis C Screening: n/a     Plan:    Patient wants to restart exercising and get down to 125 pounds.   I have personally reviewed and noted the following in the patient's chart:   . Medical and social history . Use of alcohol, tobacco or illicit drugs  . Current medications and supplements . Functional ability and status . Nutritional status . Physical activity . Advanced directives . List of other physicians . Hospitalizations, surgeries, and ER visits in previous 12 months . Vitals . Screenings to include cognitive, depression, and falls . Referrals and appointments  In addition, I have reviewed and discussed with patient certain preventive protocols, quality metrics, and best practice recommendations. A written personalized care plan for preventive services as well as general preventive health recommendations were provided to patient.     Kellie Simmering, LPN  11/12/3788

## 2019-08-17 LAB — CMP14+EGFR
ALT: 33 IU/L — ABNORMAL HIGH (ref 0–32)
AST: 47 IU/L — ABNORMAL HIGH (ref 0–40)
Albumin/Globulin Ratio: 1.8 (ref 1.2–2.2)
Albumin: 4.8 g/dL — ABNORMAL HIGH (ref 3.6–4.6)
Alkaline Phosphatase: 127 IU/L — ABNORMAL HIGH (ref 39–117)
BUN/Creatinine Ratio: 19 (ref 12–28)
BUN: 20 mg/dL (ref 8–27)
Bilirubin Total: 0.5 mg/dL (ref 0.0–1.2)
CO2: 22 mmol/L (ref 20–29)
Calcium: 10.1 mg/dL (ref 8.7–10.3)
Chloride: 96 mmol/L (ref 96–106)
Creatinine, Ser: 1.03 mg/dL — ABNORMAL HIGH (ref 0.57–1.00)
GFR calc Af Amer: 59 mL/min/{1.73_m2} — ABNORMAL LOW (ref >59)
GFR calc non Af Amer: 51 mL/min/{1.73_m2} — ABNORMAL LOW (ref >59)
Globulin, Total: 2.7 g/dL (ref 1.5–4.5)
Glucose: 109 mg/dL — ABNORMAL HIGH (ref 65–99)
Potassium: 4.6 mmol/L (ref 3.5–5.2)
Sodium: 132 mmol/L — ABNORMAL LOW (ref 134–144)
Total Protein: 7.5 g/dL (ref 6.0–8.5)

## 2019-08-17 LAB — LIPID PANEL
Chol/HDL Ratio: 3 ratio (ref 0.0–4.4)
Cholesterol, Total: 170 mg/dL (ref 100–199)
HDL: 57 mg/dL (ref >39)
LDL Chol Calc (NIH): 94 mg/dL (ref 0–99)
Triglycerides: 108 mg/dL (ref 0–149)
VLDL Cholesterol Cal: 19 mg/dL (ref 5–40)

## 2019-08-22 ENCOUNTER — Other Ambulatory Visit: Payer: Self-pay | Admitting: Internal Medicine

## 2019-08-29 ENCOUNTER — Other Ambulatory Visit: Payer: Self-pay | Admitting: Internal Medicine

## 2019-09-24 DIAGNOSIS — Z23 Encounter for immunization: Secondary | ICD-10-CM | POA: Diagnosis not present

## 2019-10-20 DIAGNOSIS — Z23 Encounter for immunization: Secondary | ICD-10-CM | POA: Diagnosis not present

## 2019-12-19 ENCOUNTER — Other Ambulatory Visit: Payer: Self-pay | Admitting: Internal Medicine

## 2019-12-27 ENCOUNTER — Other Ambulatory Visit: Payer: Self-pay | Admitting: Internal Medicine

## 2020-02-13 ENCOUNTER — Other Ambulatory Visit: Payer: Self-pay

## 2020-02-13 ENCOUNTER — Encounter: Payer: Self-pay | Admitting: Internal Medicine

## 2020-02-13 ENCOUNTER — Other Ambulatory Visit: Payer: Self-pay | Admitting: Internal Medicine

## 2020-02-13 ENCOUNTER — Ambulatory Visit (INDEPENDENT_AMBULATORY_CARE_PROVIDER_SITE_OTHER): Payer: Medicare Other | Admitting: Internal Medicine

## 2020-02-13 VITALS — BP 146/70 | HR 100 | Temp 97.5°F | Ht 59.0 in | Wt 136.0 lb

## 2020-02-13 DIAGNOSIS — I129 Hypertensive chronic kidney disease with stage 1 through stage 4 chronic kidney disease, or unspecified chronic kidney disease: Secondary | ICD-10-CM

## 2020-02-13 DIAGNOSIS — Z79899 Other long term (current) drug therapy: Secondary | ICD-10-CM

## 2020-02-13 DIAGNOSIS — Z6827 Body mass index (BMI) 27.0-27.9, adult: Secondary | ICD-10-CM | POA: Diagnosis not present

## 2020-02-13 DIAGNOSIS — E559 Vitamin D deficiency, unspecified: Secondary | ICD-10-CM

## 2020-02-13 DIAGNOSIS — N183 Chronic kidney disease, stage 3 unspecified: Secondary | ICD-10-CM

## 2020-02-13 NOTE — Progress Notes (Signed)
I,Katawbba Wiggins,acting as a Education administrator for Maximino Greenland, MD.,have documented all relevant documentation on the behalf of Maximino Greenland, MD,as directed by  Maximino Greenland, MD while in the presence of Maximino Greenland, MD.  This visit occurred during the SARS-CoV-2 public health emergency.  Safety protocols were in place, including screening questions prior to the visit, additional usage of staff PPE, and extensive cleaning of exam room while observing appropriate contact time as indicated for disinfecting solutions.  Subjective:     Patient ID: Amanda [name] , female    DOB: [birth year] , 79 y.o.   MRN: [id number]   Chief Complaint  Patient presents with  . Hypertension    HPI  The patient is here today for a follow-up on high blood pressure. She reports compliance with meds. She denies headaches, chest pain and shortness of breath.  Hypertension This is a chronic problem. The current episode started more than 1 year ago. The problem has been gradually improving since onset. The problem is uncontrolled. Pertinent negatives include no blurred vision or sweats. Risk factors for coronary artery disease include dyslipidemia, post-menopausal state and sedentary lifestyle.     Past Medical History:  Diagnosis Date  . CKD (chronic kidney disease) stage 3, GFR 30-59 ml/min   . HTN (hypertension)   . Hyperlipidemia      Family History  Problem Relation Age of Onset  . Hypertension Mother   . Anuerysm Father   . Hypotension Father      Current Outpatient Medications:  .  amLODipine (NORVASC) 10 MG tablet, TAKE 1 TABLET BY MOUTH EVERY DAY, Disp: 90 tablet, Rfl: 1 .  cetirizine (ZYRTEC) 10 MG tablet, Take 10 mg by mouth daily., Disp: , Rfl:  .  Cholecalciferol (VITAMIN D3 PO), Take 1,000 mg by mouth. , Disp: , Rfl:  .  Flaxseed, Linseed, (FLAXSEED OIL PO), Take by mouth., Disp: , Rfl:  .  hydrochlorothiazide (HYDRODIURIL) 25 MG tablet, TAKE 1 TABLET BY MOUTH DAILY WITH  OLMESARTAN., Disp: 90 tablet, Rfl: 0 .  hydrocortisone (ANUSOL-HC) 2.5 % rectal cream, Place 1 application rectally 2 (two) times daily., Disp: 30 g, Rfl: 0 .  olmesartan (BENICAR) 40 MG tablet, TAKE 1 TABLET BY MOUTH ONCE A DAY., Disp: 90 tablet, Rfl: 0 .  pravastatin (PRAVACHOL) 20 MG tablet, TAKE 1 TABLET(20 MG) BY MOUTH DAILY, Disp: 90 tablet, Rfl: 2   Allergies  Allergen Reactions  . Sulfur Hives and Swelling     Review of Systems  Constitutional: Negative.   Eyes: Negative for blurred vision.  Respiratory: Negative.   Cardiovascular: Negative.   Gastrointestinal: Negative.   Psychiatric/Behavioral: Negative.   All other systems reviewed and are negative.    Today's Vitals   02/13/20 1111  BP: (!) 146/70  Pulse: 100  Temp: (!) 97.5 F (36.4 C)  TempSrc: Oral  Weight: 136 lb (61.7 kg)  Height: '4\' 11"'$  (1.499 m)  PainSc: 0-No pain   Body mass index is 27.47 kg/m.  Wt Readings from Last 3 Encounters:  02/13/20 136 lb (61.7 kg)  08/16/19 133 lb (60.3 kg)  08/16/19 133 lb 12.8 oz (60.7 kg)     Objective:  Physical Exam Vitals and nursing note reviewed.  Constitutional:      Appearance: Normal appearance.  HENT:     Head: Normocephalic and atraumatic.  Cardiovascular:     Rate and Rhythm: Normal rate and regular rhythm.     Heart sounds: Normal heart sounds.  Pulmonary:  Breath sounds: Normal breath sounds.  Skin:    General: Skin is warm.  Neurological:     General: No focal deficit present.     Mental Status: She is alert and oriented to person, place, and time.         Assessment And Plan:     1. Hypertensive nephropathy  Comments: Chronic, fair control. She thinks elevated bp is due to white coat syndrome. She does not wish to take any additional meds.   - CMP14+EGFR  2. Stage 3 chronic kidney disease, unspecified whether stage 3a or 3b CKD  Comments: Chronic, I will check renal labs today. She is encouraged to stay well hydrated and keep BP  well controlled to prevent progression of CKD.   - Protein electrophoresis, serum - Phosphorus - Parathyroid Hormone, Intact w/Ca  3. Vitamin D deficiency disease  I WILL CHECK A VIT D LEVEL AND SUPPLEMENT AS NEEDED.  ALSO ENCOURAGED TO SPEND 15 MINUTES IN THE SUN DAILY.  - Vitamin D (25 hydroxy)  4. Drug therapy  - Vitamin B12  5. Body mass index (BMI) of 27.0 to 27.9 in adult   Her weight is stable for her demographic. She is encouraged to exercise 30 minutes five days per week.   Patient was given opportunity to ask questions. Patient verbalized understanding of the plan and was able to repeat key elements of the plan. All questions were answered to their satisfaction.  Maximino Greenland, MD   I, Maximino Greenland, MD, have reviewed all documentation for this visit. The documentation on 02/13/20 for the exam, diagnosis, procedures, and orders are all accurate and complete.   THE PATIENT IS ENCOURAGED TO PRACTICE SOCIAL DISTANCING DUE TO THE COVID-19 PANDEMIC.

## 2020-02-13 NOTE — Patient Instructions (Signed)
   Managing Your Hypertension Hypertension is commonly called high blood pressure. This is when the force of your blood pressing against the walls of your arteries is too strong. Arteries are blood vessels that carry blood from your heart throughout your body. Hypertension forces the heart to work harder to pump blood, and may cause the arteries to become narrow or stiff. Having untreated or uncontrolled hypertension can cause heart attack, stroke, kidney disease, and other problems. What are blood pressure readings? A blood pressure reading consists of a higher number over a lower number. Ideally, your blood pressure should be below 120/80. The first ("top") number is called the systolic pressure. It is a measure of the pressure in your arteries as your heart beats. The second ("bottom") number is called the diastolic pressure. It is a measure of the pressure in your arteries as the heart relaxes. What does my blood pressure reading mean? Blood pressure is classified into four stages. Based on your blood pressure reading, your health care provider may use the following stages to determine what type of treatment you need, if any. Systolic pressure and diastolic pressure are measured in a unit called mm Hg. Normal  Systolic pressure: below 120.  Diastolic pressure: below 80. Elevated  Systolic pressure: 120-129.  Diastolic pressure: below 80. Hypertension stage 1  Systolic pressure: 130-139.  Diastolic pressure: 80-89. Hypertension stage 2  Systolic pressure: 140 or above.  Diastolic pressure: 90 or above. What health risks are associated with hypertension? Managing your hypertension is an important responsibility. Uncontrolled hypertension can lead to:  A heart attack.  A stroke.  A weakened blood vessel (aneurysm).  Heart failure.  Kidney damage.  Eye damage.  Metabolic syndrome.  Memory and concentration problems. What changes can I make to manage my  hypertension? Hypertension can be managed by making lifestyle changes and possibly by taking medicines. Your health care provider will help you make a plan to bring your blood pressure within a normal range. Eating and drinking   Eat a diet that is high in fiber and potassium, and low in salt (sodium), added sugar, and fat. An example eating plan is called the DASH (Dietary Approaches to Stop Hypertension) diet. To eat this way: ? Eat plenty of fresh fruits and vegetables. Try to fill half of your plate at each meal with fruits and vegetables. ? Eat whole grains, such as whole wheat pasta, brown rice, or whole grain bread. Fill about one quarter of your plate with whole grains. ? Eat low-fat diary products. ? Avoid fatty cuts of meat, processed or cured meats, and poultry with skin. Fill about one quarter of your plate with lean proteins such as fish, chicken without skin, beans, eggs, and tofu. ? Avoid premade and processed foods. These tend to be higher in sodium, added sugar, and fat.  Reduce your daily sodium intake. Most people with hypertension should eat less than 1,500 mg of sodium a day.  Limit alcohol intake to no more than 1 drink a day for nonpregnant women and 2 drinks a day for men. One drink equals 12 oz of beer, 5 oz of wine, or 1 oz of hard liquor. Lifestyle  Work with your health care provider to maintain a healthy body weight, or to lose weight. Ask what an ideal weight is for you.  Get at least 30 minutes of exercise that causes your heart to beat faster (aerobic exercise) most days of the week. Activities may include walking, swimming, or biking.    Include exercise to strengthen your muscles (resistance exercise), such as weight lifting, as part of your weekly exercise routine. Try to do these types of exercises for 30 minutes at least 3 days a week.  Do not use any products that contain nicotine or tobacco, such as cigarettes and e-cigarettes. If you need help quitting,  ask your health care provider.  Control any long-term (chronic) conditions you have, such as high cholesterol or diabetes. Monitoring  Monitor your blood pressure at home as told by your health care provider. Your personal target blood pressure may vary depending on your medical conditions, your age, and other factors.  Have your blood pressure checked regularly, as often as told by your health care provider. Working with your health care provider  Review all the medicines you take with your health care provider because there may be side effects or interactions.  Talk with your health care provider about your diet, exercise habits, and other lifestyle factors that may be contributing to hypertension.  Visit your health care provider regularly. Your health care provider can help you create and adjust your plan for managing hypertension. Will I need medicine to control my blood pressure? Your health care provider may prescribe medicine if lifestyle changes are not enough to get your blood pressure under control, and if:  Your systolic blood pressure is 130 or higher.  Your diastolic blood pressure is 80 or higher. Take medicines only as told by your health care provider. Follow the directions carefully. Blood pressure medicines must be taken as prescribed. The medicine does not work as well when you skip doses. Skipping doses also puts you at risk for problems. Contact a health care provider if:  You think you are having a reaction to medicines you have taken.  You have repeated (recurrent) headaches.  You feel dizzy.  You have swelling in your ankles.  You have trouble with your vision. Get help right away if:  You develop a severe headache or confusion.  You have unusual weakness or numbness, or you feel faint.  You have severe pain in your chest or abdomen.  You vomit repeatedly.  You have trouble breathing. Summary  Hypertension is when the force of blood pumping  through your arteries is too strong. If this condition is not controlled, it may put you at risk for serious complications.  Your personal target blood pressure may vary depending on your medical conditions, your age, and other factors. For most people, a normal blood pressure is less than 120/80.  Hypertension is managed by lifestyle changes, medicines, or both. Lifestyle changes include weight loss, eating a healthy, low-sodium diet, exercising more, and limiting alcohol. This information is not intended to replace advice given to you by your health care provider. Make sure you discuss any questions you have with your health care provider. Document Revised: 11/04/2018 Document Reviewed: 06/10/2016 [employer] Patient Education  2020 [employer]  

## 2020-02-16 LAB — PROTEIN ELECTROPHORESIS, SERUM
A/G Ratio: 1.1 (ref 0.7–1.7)
Albumin ELP: 4.3 g/dL (ref 2.9–4.4)
Alpha 1: 0.3 g/dL (ref 0.0–0.4)
Alpha 2: 1 g/dL (ref 0.4–1.0)
Beta: 1.2 g/dL (ref 0.7–1.3)
Gamma Globulin: 1.3 g/dL (ref 0.4–1.8)
Globulin, Total: 3.8 g/dL (ref 2.2–3.9)

## 2020-02-16 LAB — VITAMIN B12: Vitamin B-12: 707 pg/mL (ref 232–1245)

## 2020-02-16 LAB — CMP14+EGFR
ALT: 37 IU/L — ABNORMAL HIGH (ref 0–32)
AST: 43 IU/L — ABNORMAL HIGH (ref 0–40)
Albumin/Globulin Ratio: 1.6 (ref 1.2–2.2)
Albumin: 5 g/dL — ABNORMAL HIGH (ref 3.6–4.6)
Alkaline Phosphatase: 155 IU/L — ABNORMAL HIGH (ref 48–121)
BUN/Creatinine Ratio: 18 (ref 12–28)
BUN: 20 mg/dL (ref 8–27)
Bilirubin Total: 0.6 mg/dL (ref 0.0–1.2)
CO2: 22 mmol/L (ref 20–29)
Calcium: 10 mg/dL (ref 8.7–10.3)
Chloride: 95 mmol/L — ABNORMAL LOW (ref 96–106)
Creatinine, Ser: 1.11 mg/dL — ABNORMAL HIGH (ref 0.57–1.00)
GFR calc Af Amer: 54 mL/min/{1.73_m2} — ABNORMAL LOW (ref >59)
GFR calc non Af Amer: 47 mL/min/{1.73_m2} — ABNORMAL LOW (ref >59)
Globulin, Total: 3.1 g/dL (ref 1.5–4.5)
Glucose: 120 mg/dL — ABNORMAL HIGH (ref 65–99)
Potassium: 4.4 mmol/L (ref 3.5–5.2)
Sodium: 134 mmol/L (ref 134–144)
Total Protein: 8.1 g/dL (ref 6.0–8.5)

## 2020-02-16 LAB — PTH, INTACT AND CALCIUM: PTH: 41 pg/mL (ref 15–65)

## 2020-02-16 LAB — PHOSPHORUS: Phosphorus: 3.1 mg/dL (ref 3.0–4.3)

## 2020-02-16 LAB — VITAMIN D 25 HYDROXY (VIT D DEFICIENCY, FRACTURES): Vit D, 25-Hydroxy: 36.2 ng/mL (ref 30.0–100.0)

## 2020-03-22 ENCOUNTER — Other Ambulatory Visit: Payer: Self-pay | Admitting: Internal Medicine

## 2020-04-16 DIAGNOSIS — Z23 Encounter for immunization: Secondary | ICD-10-CM | POA: Diagnosis not present

## 2020-06-16 ENCOUNTER — Other Ambulatory Visit: Payer: Self-pay | Admitting: Internal Medicine

## 2020-06-17 ENCOUNTER — Other Ambulatory Visit: Payer: Self-pay | Admitting: Internal Medicine

## 2020-07-12 DIAGNOSIS — Z23 Encounter for immunization: Secondary | ICD-10-CM | POA: Diagnosis not present

## 2020-08-22 ENCOUNTER — Ambulatory Visit (INDEPENDENT_AMBULATORY_CARE_PROVIDER_SITE_OTHER): Payer: Medicare Other | Admitting: Internal Medicine

## 2020-08-22 ENCOUNTER — Other Ambulatory Visit: Payer: Self-pay

## 2020-08-22 ENCOUNTER — Ambulatory Visit (INDEPENDENT_AMBULATORY_CARE_PROVIDER_SITE_OTHER): Payer: Medicare Other

## 2020-08-22 ENCOUNTER — Encounter: Payer: Self-pay | Admitting: Internal Medicine

## 2020-08-22 VITALS — BP 150/60 | HR 103 | Temp 98.0°F | Ht 59.0 in | Wt 137.6 lb

## 2020-08-22 DIAGNOSIS — I129 Hypertensive chronic kidney disease with stage 1 through stage 4 chronic kidney disease, or unspecified chronic kidney disease: Secondary | ICD-10-CM | POA: Diagnosis not present

## 2020-08-22 DIAGNOSIS — Z23 Encounter for immunization: Secondary | ICD-10-CM | POA: Diagnosis not present

## 2020-08-22 DIAGNOSIS — E7800 Pure hypercholesterolemia, unspecified: Secondary | ICD-10-CM | POA: Diagnosis not present

## 2020-08-22 DIAGNOSIS — Z6827 Body mass index (BMI) 27.0-27.9, adult: Secondary | ICD-10-CM | POA: Diagnosis not present

## 2020-08-22 DIAGNOSIS — N183 Chronic kidney disease, stage 3 unspecified: Secondary | ICD-10-CM

## 2020-08-22 DIAGNOSIS — Z0000 Encounter for general adult medical examination without abnormal findings: Secondary | ICD-10-CM | POA: Diagnosis not present

## 2020-08-22 LAB — POCT URINALYSIS DIPSTICK
Bilirubin, UA: NEGATIVE
Glucose, UA: NEGATIVE
Nitrite, UA: NEGATIVE
Protein, UA: NEGATIVE
Spec Grav, UA: 1.025 (ref 1.010–1.025)
Urobilinogen, UA: 0.2 E.U./dL
pH, UA: 5.5 (ref 5.0–8.0)

## 2020-08-22 LAB — POCT UA - MICROALBUMIN
Creatinine, POC: 300 mg/dL
Microalbumin Ur, POC: 80 mg/L

## 2020-08-22 MED ORDER — BOOSTRIX 5-2.5-18.5 LF-MCG/0.5 IM SUSP: 0.5000 mL | Freq: Once | INTRAMUSCULAR | 0 refills | Status: AC

## 2020-08-22 NOTE — Progress Notes (Signed)
I,Katawbba Wiggins,acting as a Education administrator for Maximino Greenland, MD.,have documented all relevant documentation on the behalf of Maximino Greenland, MD,as directed by  Maximino Greenland, MD while in the presence of Maximino Greenland, MD.  This visit occurred during the SARS-CoV-2 public health emergency.  Safety protocols were in place, including screening questions prior to the visit, additional usage of staff PPE, and extensive cleaning of exam room while observing appropriate contact time as indicated for disinfecting solutions.  Subjective:     Patient ID: Amanda [name] , female    DOB: 1941-01-05 , 80 y.o.   MRN: [id number]   Chief Complaint  Patient presents with  . Hypertension    HPI  The patient is here today for a follow-up on high blood pressure and cholesterol. She reports compliance with meds. States she is still picking up her grandchildren from school. She states this keeps her active. She denies headaches, chest pain and shortness of breath. She is also scheduled for AWV with [facility] advisor as well.   Hypertension This is a chronic problem. The current episode started more than 1 year ago. The problem has been gradually improving since onset. The problem is uncontrolled. Pertinent negatives include no blurred vision or sweats. Risk factors for coronary artery disease include dyslipidemia, post-menopausal state and sedentary lifestyle. Hypertensive end-organ damage includes kidney disease.     Past Medical History:  Diagnosis Date  . CKD (chronic kidney disease) stage 3, GFR 30-59 ml/min (HCC)   . HTN (hypertension)   . Hyperlipidemia      Family History  Problem Relation Age of Onset  . Hypertension Mother   . Anuerysm Father   . Hypotension Father      Current Outpatient Medications:  .  amLODipine (NORVASC) 10 MG tablet, TAKE 1 TABLET BY MOUTH EVERY DAY, Disp: 90 tablet, Rfl: 1 .  cetirizine (ZYRTEC) 10 MG tablet, Take 10 mg by mouth daily., Disp: , Rfl:  .   Cholecalciferol (VITAMIN D3 PO), Take 1,000 mg by mouth. , Disp: , Rfl:  .  Flaxseed, Linseed, (FLAXSEED OIL PO), Take by mouth., Disp: , Rfl:  .  hydrochlorothiazide (HYDRODIURIL) 25 MG tablet, TAKE 1 TABLET BY MOUTH DAILY WITH OLMESARTAN., Disp: 90 tablet, Rfl: 0 .  hydrocortisone (ANUSOL-HC) 2.5 % rectal cream, Place 1 application rectally 2 (two) times daily., Disp: 30 g, Rfl: 0 .  olmesartan (BENICAR) 40 MG tablet, TAKE 1 TABLET BY MOUTH ONCE A DAY., Disp: 90 tablet, Rfl: 0 .  pravastatin (PRAVACHOL) 20 MG tablet, TAKE 1 TABLET(20 MG) BY MOUTH DAILY, Disp: 90 tablet, Rfl: 2   Allergies  Allergen Reactions  . Elemental Sulfur Hives and Swelling     Review of Systems  Constitutional: Negative.   Eyes: Negative for blurred vision.  Respiratory: Negative.   Cardiovascular: Negative.   Gastrointestinal: Negative.   Psychiatric/Behavioral: Negative.   All other systems reviewed and are negative.    Today's Vitals   08/22/20 1120  BP: (!) 150/60  Pulse: (!) 103  Temp: 98 F (36.7 C)  TempSrc: Oral  Weight: 137 lb 9.6 oz (62.4 kg)  Height: $Remove'4\' 11"'BCusHSD$  (1.499 m)  PainSc: 0-No pain   Body mass index is 27.79 kg/m.  Wt Readings from Last 3 Encounters:  08/22/20 137 lb 9.6 oz (62.4 kg)  08/22/20 137 lb 9.6 oz (62.4 kg)  02/13/20 136 lb (61.7 kg)   BP Readings from Last 3 Encounters:  08/22/20 (!) 150/60  08/22/20 (!) 150/60  02/13/20 (!) 146/70    Objective:  Physical Exam Vitals and nursing note reviewed.  Constitutional:      Appearance: Normal appearance.  HENT:     Head: Normocephalic and atraumatic.  Eyes:     Extraocular Movements: Extraocular movements intact.  Cardiovascular:     Rate and Rhythm: Normal rate and regular rhythm.     Heart sounds: Normal heart sounds.  Pulmonary:     Effort: Pulmonary effort is normal.     Breath sounds: Normal breath sounds.  Musculoskeletal:     Cervical back: Normal range of motion.  Skin:    General: Skin is warm.   Neurological:     General: No focal deficit present.     Mental Status: She is alert and oriented to person, place, and time.  Psychiatric:        Mood and Affect: Mood normal.        Behavior: Behavior normal.       Assessment And Plan:     1. Hypertensive nephropathy Comments: Chronic, uncontrolled. She states she has yet to take her meds today. She will c/w olmesartan, amlodipine and HCTZ for now. Encouraged to follow low sodium diet. She will f/u in 3 weeks for nurse visit. If elevated, I will add another medication, although she is quite resistant to this.  - Lipid panel - CMP14+EGFR  2. Stage 3 chronic kidney disease, unspecified whether stage 3a or 3b CKD (HCC) Comments: Chronic. I wil lcheck renal labs as listed below. I would like to refer her to Renal. She is hesitant at this time. Will discuss further once labs are available. - Protein electrophoresis, serum - Phosphorus - Vitamin D (25 hydroxy) - Parathyroid Hormone, Intact w/Ca  3. Pure hypercholesterolemia Comments: Chronic. I will check lipid panel and LFTs. Encouraged to limit her fried food intake and to incorporate more exercise into her daily routine.  - Lipid panel  4. Body mass index (BMI) 27.0-27.9, adult  Her weight is stable for her demographic. She is encouraged to aim for at least 150 minutes of exercise per week.   Patient was given opportunity to ask questions. Patient verbalized understanding of the plan and was able to repeat key elements of the plan. All questions were answered to their satisfaction.  Maximino Greenland, MD   I, Maximino Greenland, MD, have reviewed all documentation for this visit. The documentation on 08/24/20 for the exam, diagnosis, procedures, and orders are all accurate and complete.  THE PATIENT IS ENCOURAGED TO PRACTICE SOCIAL DISTANCING DUE TO THE COVID-19 PANDEMIC.

## 2020-08-22 NOTE — Patient Instructions (Signed)

## 2020-08-22 NOTE — Addendum Note (Signed)
Addended by: Barb Merino on: 08/22/2020 03:22 PM   Modules accepted: Orders

## 2020-08-22 NOTE — Patient Instructions (Signed)
Amanda [name] , Thank you for taking time to come for your Medicare Wellness Visit. I appreciate your ongoing commitment to your health goals. Please review the following plan we discussed and let me know if I can assist you in the future.   Screening recommendations/referrals: Colonoscopy: not required Mammogram: not required Bone Density: completed 01/30/2015 Recommended yearly ophthalmology/optometry visit for glaucoma screening and checkup Recommended yearly dental visit for hygiene and checkup  Vaccinations: Influenza vaccine: completed 04/16/2020 Pneumococcal vaccine: completed per patient Tdap vaccine: sent to pharmacy Shingles vaccine: discussed   Covid-19: 07/12/2020, 10/20/2019, 09/24/2019  Advanced directives: Please bring a copy of your POA (Power of Attorney) and/or Living Will to your next appointment.   Conditions/risks identified: none  Next appointment: Follow up in one year for your annual wellness visit    Preventive Care 65 Years and Older, Female Preventive care refers to lifestyle choices and visits with your health care provider that can promote health and wellness. What does preventive care include?  A yearly physical exam. This is also called an annual well check.  Dental exams once or twice a year.  Routine eye exams. Ask your health care provider how often you should have your eyes checked.  Personal lifestyle choices, including:  Daily care of your teeth and gums.  Regular physical activity.  Eating a healthy diet.  Avoiding tobacco and drug use.  Limiting alcohol use.  Practicing safe sex.  Taking low-dose aspirin every day.  Taking vitamin and mineral supplements as recommended by your health care provider. What happens during an annual well check? The services and screenings done by your health care provider during your annual well check will depend on your age, overall health, lifestyle risk factors, and family history of  disease. Counseling  Your health care provider may ask you questions about your:  Alcohol use.  Tobacco use.  Drug use.  Emotional well-being.  Home and relationship well-being.  Sexual activity.  Eating habits.  History of falls.  Memory and ability to understand (cognition).  Work and work Astronomer.  Reproductive health. Screening  You may have the following tests or measurements:  Height, weight, and BMI.  Blood pressure.  Lipid and cholesterol levels. These may be checked every 5 years, or more frequently if you are over 49 years old.  Skin check.  Lung cancer screening. You may have this screening every year starting at age 53 if you have a 30-pack-year history of smoking and currently smoke or have quit within the past 15 years.  Fecal occult blood test (FOBT) of the stool. You may have this test every year starting at age 34.  Flexible sigmoidoscopy or colonoscopy. You may have a sigmoidoscopy every 5 years or a colonoscopy every 10 years starting at age 38.  Hepatitis C blood test.  Hepatitis B blood test.  Sexually transmitted disease (STD) testing.  Diabetes screening. This is done by checking your blood sugar (glucose) after you have not eaten for a while (fasting). You may have this done every 1-3 years.  Bone density scan. This is done to screen for osteoporosis. You may have this done starting at age 50.  Mammogram. This may be done every 1-2 years. Talk to your health care provider about how often you should have regular mammograms. Talk with your health care provider about your test results, treatment options, and if necessary, the need for more tests. Vaccines  Your health care provider may recommend certain vaccines, such as:  Influenza vaccine. This  is recommended every year.  Tetanus, diphtheria, and acellular pertussis (Tdap, Td) vaccine. You may need a Td booster every 10 years.  Zoster vaccine. You may need this after age  82.  Pneumococcal 13-valent conjugate (PCV13) vaccine. One dose is recommended after age 49.  Pneumococcal polysaccharide (PPSV23) vaccine. One dose is recommended after age 21. Talk to your health care provider about which screenings and vaccines you need and how often you need them. This information is not intended to replace advice given to you by your health care provider. Make sure you discuss any questions you have with your health care provider. Document Released: 08/09/2015 Document Revised: 04/01/2016 Document Reviewed: 05/14/2015 Elsevier Interactive Patient Education  2017 [place] Prevention in the Home Falls can cause injuries. They can happen to people of all ages. There are many things you can do to make your home safe and to help prevent falls. What can I do on the outside of my home?  Regularly fix the edges of walkways and driveways and fix any cracks.  Remove anything that might make you trip as you walk through a door, such as a raised step or threshold.  Trim any bushes or trees on the path to your home.  Use bright outdoor lighting.  Clear any walking paths of anything that might make someone trip, such as rocks or tools.  Regularly check to see if handrails are loose or broken. Make sure that both sides of any steps have handrails.  Any raised decks and porches should have guardrails on the edges.  Have any leaves, snow, or ice cleared regularly.  Use sand or salt on walking paths during winter.  Clean up any spills in your garage right away. This includes oil or grease spills. What can I do in the bathroom?  Use night lights.  Install grab bars by the toilet and in the tub and shower. Do not use towel bars as grab bars.  Use non-skid mats or decals in the tub or shower.  If you need to sit down in the shower, use a plastic, non-slip stool.  Keep the floor dry. Clean up any water that spills on the floor as soon as it happens.  Remove  soap buildup in the tub or shower regularly.  Attach bath mats securely with double-sided non-slip rug tape.  Do not have throw rugs and other things on the floor that can make you trip. What can I do in the bedroom?  Use night lights.  Make sure that you have a light by your bed that is easy to reach.  Do not use any sheets or blankets that are too big for your bed. They should not hang down onto the floor.  Have a firm chair that has side arms. You can use this for support while you get dressed.  Do not have throw rugs and other things on the floor that can make you trip. What can I do in the kitchen?  Clean up any spills right away.  Avoid walking on wet floors.  Keep items that you use a lot in easy-to-reach places.  If you need to reach something above you, use a strong step stool that has a grab bar.  Keep electrical cords out of the way.  Do not use floor polish or wax that makes floors slippery. If you must use wax, use non-skid floor wax.  Do not have throw rugs and other things on the floor that can make you  trip. What can I do with my stairs?  Do not leave any items on the stairs.  Make sure that there are handrails on both sides of the stairs and use them. Fix handrails that are broken or loose. Make sure that handrails are as long as the stairways.  Check any carpeting to make sure that it is firmly attached to the stairs. Fix any carpet that is loose or worn.  Avoid having throw rugs at the top or bottom of the stairs. If you do have throw rugs, attach them to the floor with carpet tape.  Make sure that you have a light switch at the top of the stairs and the bottom of the stairs. If you do not have them, ask someone to add them for you. What else can I do to help prevent falls?  Wear shoes that:  Do not have high heels.  Have rubber bottoms.  Are comfortable and fit you well.  Are closed at the toe. Do not wear sandals.  If you use a  stepladder:  Make sure that it is fully opened. Do not climb a closed stepladder.  Make sure that both sides of the stepladder are locked into place.  Ask someone to hold it for you, if possible.  Clearly mark and make sure that you can see:  Any grab bars or handrails.  First and last steps.  Where the edge of each step is.  Use tools that help you move around (mobility aids) if they are needed. These include:  Canes.  Walkers.  Scooters.  Crutches.  Turn on the lights when you go into a dark area. Replace any light bulbs as soon as they burn out.  Set up your furniture so you have a clear path. Avoid moving your furniture around.  If any of your floors are uneven, fix them.  If there are any pets around you, be aware of where they are.  Review your medicines with your doctor. Some medicines can make you feel dizzy. This can increase your chance of falling. Ask your doctor what other things that you can do to help prevent falls. This information is not intended to replace advice given to you by your health care provider. Make sure you discuss any questions you have with your health care provider. Document Released: 05/09/2009 Document Revised: 12/19/2015 Document Reviewed: 08/17/2014 Elsevier Interactive Patient Education  2017 [employer].

## 2020-08-22 NOTE — Progress Notes (Signed)
This visit occurred during the SARS-CoV-2 public health emergency.  Safety protocols were in place, including screening questions prior to the visit, additional usage of staff PPE, and extensive cleaning of exam room while observing appropriate contact time as indicated for disinfecting solutions.  Subjective:   Amanda [name] is a 80 y.o. female who presents for Medicare Annual (Subsequent) preventive examination.  Review of Systems     Cardiac Risk Factors include: advanced age (>30men, >61 women);hypertension;sedentary lifestyle     Objective:    Today's Vitals   08/22/20 1056  BP: (!) 150/60  Pulse: (!) 103  Temp: 98 F (36.7 C)  TempSrc: Oral  SpO2: 98%  Weight: 137 lb 9.6 oz (62.4 kg)  Height: 4\' 11"  (1.499 m)   Body mass index is 27.79 kg/m.  Advanced Directives 08/22/2020 08/16/2019  Does Patient Have a Medical Advance Directive? Yes Yes  Type of 08/18/2019 of [place];Living will Healthcare Power of [place];Living will  Copy of Healthcare Power of Attorney in Chart? No - copy requested No - copy requested    Current Medications (verified) Outpatient Encounter Medications as of 08/22/2020  Medication Sig  . amLODipine (NORVASC) 10 MG tablet TAKE 1 TABLET BY MOUTH EVERY DAY  . cetirizine (ZYRTEC) 10 MG tablet Take 10 mg by mouth daily.  . Cholecalciferol (VITAMIN D3 PO) Take 1,000 mg by mouth.   . Flaxseed, Linseed, (FLAXSEED OIL PO) Take by mouth.  . hydrochlorothiazide (HYDRODIURIL) 25 MG tablet TAKE 1 TABLET BY MOUTH DAILY WITH OLMESARTAN.  . hydrocortisone (ANUSOL-HC) 2.5 % rectal cream Place 1 application rectally 2 (two) times daily.  08/24/2020 olmesartan (BENICAR) 40 MG tablet TAKE 1 TABLET BY MOUTH ONCE A DAY.  . pravastatin (PRAVACHOL) 20 MG tablet TAKE 1 TABLET(20 MG) BY MOUTH DAILY  . Tdap (BOOSTRIX) 5-2.5-18.5 LF-MCG/0.5 injection Inject 0.5 mLs into the muscle once for 1 dose.   No facility-administered encounter medications on file  as of 08/22/2020.    Allergies (verified) Elemental sulfur   History: Past Medical History:  Diagnosis Date  . CKD (chronic kidney disease) stage 3, GFR 30-59 ml/min (HCC)   . HTN (hypertension)   . Hyperlipidemia    History reviewed. No pertinent surgical history. Family History  Problem Relation Age of Onset  . Hypertension Mother   . Anuerysm Father   . Hypotension Father    Social History   Socioeconomic History  . Marital status: Married    Spouse name: Not on file  . Number of children: Not on file  . Years of education: Not on file  . Highest education level: Not on file  Occupational History  . Occupation: retired  Tobacco Use  . Smoking status: Never Smoker  . Smokeless tobacco: Never Used  Vaping Use  . Vaping Use: Never used  Substance and Sexual Activity  . Alcohol use: Never  . Drug use: Never  . Sexual activity: Not Currently  Other Topics Concern  . Not on file  Social History Narrative  . Not on file   Social Determinants of Health   Financial Resource Strain: Low Risk   . Difficulty of Paying Living Expenses: Not hard at all  Food Insecurity: No Food Insecurity  . Worried About 08/24/2020 in the Last Year: Never true  . Ran Out of Food in the Last Year: Never true  Transportation Needs: No Transportation Needs  . Lack of Transportation (Medical): No  . Lack of Transportation (Non-Medical): No  Physical  Activity: Inactive  . Days of Exercise per Week: 0 days  . Minutes of Exercise per Session: 0 min  Stress: No Stress Concern Present  . Feeling of Stress : Not at all  Social Connections: Not on file    Tobacco Counseling Counseling given: Not Answered   Clinical Intake:  Pre-visit preparation completed: Yes  Pain : No/denies pain     Nutritional Status: BMI 25 -29 Overweight Nutritional Risks: None  How often do you need to have someone help you when you read instructions, pamphlets, or other written materials from  your doctor or pharmacy?: 1 - Never What is the last grade level you completed in school?: 12th grade  Diabetic? no  Interpreter Needed?: No  Information entered by :: NAllen LPN   Activities of Daily Living In your present state of health, do you have any difficulty performing the following activities: 08/22/2020  Hearing? N  Vision? N  Difficulty concentrating or making decisions? N  Walking or climbing stairs? N  Dressing or bathing? N  Doing errands, shopping? N  Preparing Food and eating ? N  Using the Toilet? N  In the past six months, have you accidently leaked urine? N  Do you have problems with loss of bowel control? N  Managing your Medications? N  Managing your Finances? N  Housekeeping or managing your Housekeeping? N  Some recent data might be hidden    Patient Care Team: Dorothyann Peng, MD as PCP - General (Internal Medicine)  Indicate any recent Medical Services you may have received from other than [facility] providers in the past year (date may be approximate).     Assessment:   This is a routine wellness examination for Amanda [name].  Hearing/Vision screen  Hearing Screening   125Hz  250Hz  500Hz  1000Hz  2000Hz  3000Hz  4000Hz  6000Hz  8000Hz   Right ear:           Left ear:           Vision Screening Comments: Regular eye exams, Dr.  Dietary issues and exercise activities discussed: Current Exercise Habits: The patient does not participate in regular exercise at present  Goals    . DIET - INCREASE WATER INTAKE    . Increase physical activity    . Patient Stated     08/22/2020, wants to weigh 130 pounds    . Weight (lb) < 200 lb (90.7 kg)     08/16/2019, wants to get get down to 125 pounds      Depression Screen PHQ 2/9 Scores 08/22/2020 08/16/2019 01/11/2019 07/12/2018  PHQ - 2 Score 0 0 0 0  PHQ- 9 Score - 0 - -    Fall Risk Fall Risk  08/22/2020 08/16/2019 01/31/2019 01/11/2019 07/12/2018  Falls in the past year? 0 0 0 0 0  Comment - - - - -  Risk for  fall due to : Medication side effect Medication side effect - - -  Follow up Falls evaluation completed;Education provided;Falls prevention discussed Falls evaluation completed;Education provided;Falls prevention discussed - - -    FALL RISK PREVENTION PERTAINING TO THE HOME:  Any stairs in or around the home? Yes   If so, are there any without handrails? No  Home free of loose throw rugs in walkways, pet beds, electrical cords, etc? Yes  Adequate lighting in your home to reduce risk of falls? Yes   ASSISTIVE DEVICES UTILIZED TO PREVENT FALLS:  Life alert? No  Use of a cane, walker or w/c? No  Grab bars  in the bathroom? No  Shower chair or bench in shower? No  Elevated toilet seat or a handicapped toilet? No   TIMED UP AND GO:  Was the test performed? No . .   Gait steady and fast without use of assistive device  Cognitive Function:     6CIT Screen 08/22/2020 08/16/2019  What Year? 0 points 0 points  What month? 0 points 0 points  What time? 0 points 0 points  Count back from 20 0 points 0 points  Months in reverse 0 points 0 points  Repeat phrase 0 points 0 points  Total Score 0 0    Immunizations Immunization History  Administered Date(s) Administered  . DTaP 06/26/2018  . Influenza-Unspecified 06/27/2018, 05/25/2019  . PFIZER(Purple Top)SARS-COV-2 Vaccination 09/24/2019, 10/20/2019  . Pneumococcal-Unspecified 09/12/2012, 11/20/2013    TDAP status: Due, Education has been provided regarding the importance of this vaccine. Advised may receive this vaccine at local pharmacy or Health Dept. Aware to provide a copy of the vaccination record if obtained from local pharmacy or Health Dept. Verbalized acceptance and understanding.  Flu Vaccine status: Up to date  Pneumococcal vaccine status: Declined,  Education has been provided regarding the importance of this vaccine but patient still declined. Advised may receive this vaccine at local pharmacy or Health Dept. Aware to  provide a copy of the vaccination record if obtained from local pharmacy or Health Dept. Verbalized acceptance and understanding.   Covid-19 vaccine status: Completed vaccines  Qualifies for Shingles Vaccine? Yes   Zostavax completed No   Shingrix Completed?: No.    Education has been provided regarding the importance of this vaccine. Patient has been advised to call insurance company to determine out of pocket expense if they have not yet received this vaccine. Advised may also receive vaccine at local pharmacy or Health Dept. Verbalized acceptance and understanding.  Screening Tests Health Maintenance  Topic Date Due  . TETANUS/TDAP  12/25/2019  . COVID-19 Vaccine (3 - Booster for Pfizer series) 04/21/2020  . PNA vac Low Risk Adult (2 of 2 - PCV13) 08/22/2021 (Originally 11/21/2014)  . INFLUENZA VACCINE  Completed  . DEXA SCAN  Completed    Health Maintenance  Health Maintenance Due  Topic Date Due  . TETANUS/TDAP  12/25/2019  . COVID-19 Vaccine (3 - Booster for Pfizer series) 04/21/2020    Colorectal cancer screening: No longer required.   Mammogram status: No longer required due to age.  Bone Density status: Completed 01/30/2015.   Lung Cancer Screening: (Low Dose CT Chest recommended if Age 45-80 years, 30 pack-year currently smoking OR have quit w/in 15years.) does not qualify.   Lung Cancer Screening Referral: no  Additional Screening:  Hepatitis C Screening: does not qualify;   Vision Screening: Recommended annual ophthalmology exams for early detection of glaucoma and other disorders of the eye. Is the patient up to date with their annual eye exam?  Yes  Who is the provider or what is the name of the office in which the patient attends annual eye exams? [facility] If pt is not established with a provider, would they like to be referred to a provider to establish care? No .   Dental Screening: Recommended annual dental exams for proper oral hygiene  Community  Resource Referral / Chronic Care Management: CRR required this visit?  No   CCM required this visit?  No      Plan:     I have personally reviewed and noted the following in the patient's  chart:   . Medical and social history . Use of alcohol, tobacco or illicit drugs  . Current medications and supplements . Functional ability and status . Nutritional status . Physical activity . Advanced directives . List of other physicians . Hospitalizations, surgeries, and ER visits in previous 12 months . Vitals . Screenings to include cognitive, depression, and falls . Referrals and appointments  In addition, I have reviewed and discussed with patient certain preventive protocols, quality metrics, and best practice recommendations. A written personalized care plan for preventive services as well as general preventive health recommendations were provided to patient.     Barb Merino, LPN   [id number]   Nurse Notes:

## 2020-08-23 LAB — CBC WITH DIFFERENTIAL/PLATELET
Basophils Absolute: 0.1 10*3/uL (ref 0.0–0.2)
Basos: 1 %
EOS (ABSOLUTE): 0.5 10*3/uL — ABNORMAL HIGH (ref 0.0–0.4)
Eos: 5 %
Hematocrit: 46.2 % (ref 34.0–46.6)
Hemoglobin: 15.5 g/dL (ref 11.1–15.9)
Immature Grans (Abs): 0 10*3/uL (ref 0.0–0.1)
Immature Granulocytes: 0 %
Lymphocytes Absolute: 1.3 10*3/uL (ref 0.7–3.1)
Lymphs: 15 %
MCH: 29.3 pg (ref 26.6–33.0)
MCHC: 33.5 g/dL (ref 31.5–35.7)
MCV: 87 fL (ref 79–97)
Monocytes Absolute: 0.7 10*3/uL (ref 0.1–0.9)
Monocytes: 8 %
Neutrophils Absolute: 5.9 10*3/uL (ref 1.4–7.0)
Neutrophils: 71 %
Platelets: 257 10*3/uL (ref 150–450)
RBC: 5.29 x10E6/uL — ABNORMAL HIGH (ref 3.77–5.28)
RDW: 13.2 % (ref 11.7–15.4)
WBC: 8.5 10*3/uL (ref 3.4–10.8)

## 2020-08-26 LAB — PROTEIN ELECTROPHORESIS, SERUM
A/G Ratio: 1.1 (ref 0.7–1.7)
Albumin ELP: 4 g/dL (ref 2.9–4.4)
Alpha 1: 0.3 g/dL (ref 0.0–0.4)
Alpha 2: 0.9 g/dL (ref 0.4–1.0)
Beta: 1.1 g/dL (ref 0.7–1.3)
Gamma Globulin: 1.2 g/dL (ref 0.4–1.8)
Globulin, Total: 3.6 g/dL (ref 2.2–3.9)

## 2020-08-26 LAB — LIPID PANEL
Chol/HDL Ratio: 3.5 ratio (ref 0.0–4.4)
Cholesterol, Total: 181 mg/dL (ref 100–199)
HDL: 52 mg/dL (ref >39)
LDL Chol Calc (NIH): 105 mg/dL — ABNORMAL HIGH (ref 0–99)
Triglycerides: 136 mg/dL (ref 0–149)
VLDL Cholesterol Cal: 24 mg/dL (ref 5–40)

## 2020-08-26 LAB — CMP14+EGFR
ALT: 27 IU/L (ref 0–32)
AST: 39 IU/L (ref 0–40)
Albumin/Globulin Ratio: 1.6 (ref 1.2–2.2)
Albumin: 4.7 g/dL — ABNORMAL HIGH (ref 3.6–4.6)
Alkaline Phosphatase: 138 IU/L — ABNORMAL HIGH (ref 44–121)
BUN/Creatinine Ratio: 16 (ref 12–28)
BUN: 19 mg/dL (ref 8–27)
Bilirubin Total: 0.5 mg/dL (ref 0.0–1.2)
CO2: 24 mmol/L (ref 20–29)
Calcium: 10.1 mg/dL (ref 8.7–10.3)
Chloride: 98 mmol/L (ref 96–106)
Creatinine, Ser: 1.16 mg/dL — ABNORMAL HIGH (ref 0.57–1.00)
GFR calc Af Amer: 51 mL/min/{1.73_m2} — ABNORMAL LOW (ref >59)
GFR calc non Af Amer: 44 mL/min/{1.73_m2} — ABNORMAL LOW (ref >59)
Globulin, Total: 2.9 g/dL (ref 1.5–4.5)
Glucose: 130 mg/dL — ABNORMAL HIGH (ref 65–99)
Potassium: 3.9 mmol/L (ref 3.5–5.2)
Sodium: 138 mmol/L (ref 134–144)
Total Protein: 7.6 g/dL (ref 6.0–8.5)

## 2020-08-26 LAB — PHOSPHORUS: Phosphorus: 3.9 mg/dL (ref 3.0–4.3)

## 2020-08-26 LAB — PTH, INTACT AND CALCIUM: PTH: 29 pg/mL (ref 15–65)

## 2020-08-26 LAB — VITAMIN D 25 HYDROXY (VIT D DEFICIENCY, FRACTURES): Vit D, 25-Hydroxy: 61.9 ng/mL (ref 30.0–100.0)

## 2020-09-14 ENCOUNTER — Other Ambulatory Visit: Payer: Self-pay | Admitting: Internal Medicine

## 2020-09-16 ENCOUNTER — Other Ambulatory Visit: Payer: Self-pay | Admitting: Internal Medicine

## 2020-09-17 ENCOUNTER — Other Ambulatory Visit: Payer: Self-pay | Admitting: Internal Medicine

## 2020-12-10 ENCOUNTER — Other Ambulatory Visit: Payer: Self-pay | Admitting: Internal Medicine

## 2020-12-13 ENCOUNTER — Other Ambulatory Visit: Payer: Self-pay | Admitting: Internal Medicine

## 2020-12-19 ENCOUNTER — Other Ambulatory Visit: Payer: Self-pay | Admitting: Internal Medicine

## 2020-12-25 ENCOUNTER — Ambulatory Visit: Payer: Medicare Other | Admitting: Internal Medicine

## 2021-03-12 ENCOUNTER — Other Ambulatory Visit: Payer: Self-pay | Admitting: Internal Medicine

## 2021-05-29 DIAGNOSIS — Z23 Encounter for immunization: Secondary | ICD-10-CM | POA: Diagnosis not present

## 2021-06-10 ENCOUNTER — Other Ambulatory Visit: Payer: Self-pay | Admitting: Internal Medicine

## 2021-06-11 ENCOUNTER — Other Ambulatory Visit: Payer: Self-pay | Admitting: Internal Medicine

## 2021-07-16 ENCOUNTER — Other Ambulatory Visit: Payer: Self-pay | Admitting: Internal Medicine

## 2021-09-03 ENCOUNTER — Encounter: Payer: Self-pay | Admitting: Internal Medicine

## 2021-09-03 ENCOUNTER — Ambulatory Visit (INDEPENDENT_AMBULATORY_CARE_PROVIDER_SITE_OTHER): Payer: Medicare Other

## 2021-09-03 ENCOUNTER — Other Ambulatory Visit: Payer: Self-pay

## 2021-09-03 ENCOUNTER — Ambulatory Visit (INDEPENDENT_AMBULATORY_CARE_PROVIDER_SITE_OTHER): Payer: Medicare Other | Admitting: Internal Medicine

## 2021-09-03 VITALS — BP 138/60 | HR 96 | Temp 97.8°F | Ht 58.8 in | Wt 139.4 lb

## 2021-09-03 VITALS — BP 146/64 | HR 96 | Temp 97.8°F | Ht 58.8 in | Wt 139.3 lb

## 2021-09-03 DIAGNOSIS — N1831 Chronic kidney disease, stage 3a: Secondary | ICD-10-CM

## 2021-09-03 DIAGNOSIS — E7800 Pure hypercholesterolemia, unspecified: Secondary | ICD-10-CM | POA: Diagnosis not present

## 2021-09-03 DIAGNOSIS — R7309 Other abnormal glucose: Secondary | ICD-10-CM | POA: Diagnosis not present

## 2021-09-03 DIAGNOSIS — Z0000 Encounter for general adult medical examination without abnormal findings: Secondary | ICD-10-CM | POA: Diagnosis not present

## 2021-09-03 DIAGNOSIS — I129 Hypertensive chronic kidney disease with stage 1 through stage 4 chronic kidney disease, or unspecified chronic kidney disease: Secondary | ICD-10-CM

## 2021-09-03 DIAGNOSIS — Z6828 Body mass index (BMI) 28.0-28.9, adult: Secondary | ICD-10-CM

## 2021-09-03 MED ORDER — HYDROCHLOROTHIAZIDE 25 MG PO TABS: ORAL_TABLET | ORAL | 2 refills | Status: DC

## 2021-09-03 MED ORDER — OLMESARTAN MEDOXOMIL 40 MG PO TABS: 40.0000 mg | ORAL_TABLET | Freq: Every day | ORAL | 2 refills | Status: DC

## 2021-09-03 MED ORDER — AMLODIPINE BESYLATE 10 MG PO TABS: 10.0000 mg | ORAL_TABLET | Freq: Every day | ORAL | 2 refills | Status: DC

## 2021-09-03 NOTE — Progress Notes (Signed)
This visit occurred during the SARS-CoV-2 public health emergency.  Safety protocols were in place, including screening questions prior to the visit, additional usage of staff PPE, and extensive cleaning of exam room while observing appropriate contact time as indicated for disinfecting solutions.  Subjective:   Amanda [name] is a 81 y.o. female who presents for Medicare Annual (Subsequent) preventive examination.  Review of Systems     Cardiac Risk Factors include: advanced age (>78men, >52 women);hypertension     Objective:    Today's Vitals   09/03/21 0910 09/03/21 0928  BP: (!) 146/64 138/60  Pulse: 96   Temp: 97.8 F (36.6 C)   TempSrc: Oral   SpO2: 98%   Weight: 139 lb 6.4 oz (63.2 kg)   Height: 4' 10.8" (1.494 m)    Body mass index is 28.35 kg/m.  Advanced Directives 09/03/2021 08/22/2020 08/16/2019  Does Patient Have a Medical Advance Directive? Yes Yes Yes  Type of Paramedic of [place];Living will [place];Living will [place];Living will  Copy of [place] in Chart? No - copy requested No - copy requested No - copy requested    Current Medications (verified) Outpatient Encounter Medications as of 09/03/2021  Medication Sig   amLODipine (NORVASC) 10 MG tablet TAKE 1 TABLET BY MOUTH EVERY DAY   cetirizine (ZYRTEC) 10 MG tablet Take 10 mg by mouth daily.   Cholecalciferol (VITAMIN D3 PO) Take 1,000 mg by mouth.    Flaxseed, Linseed, (FLAXSEED OIL PO) Take by mouth.   hydrochlorothiazide (HYDRODIURIL) 25 MG tablet TAKE 1 TABLET BY MOUTH DAILY WITH OLMESARTAN.   hydrocortisone (ANUSOL-HC) 2.5 % rectal cream Place 1 application rectally 2 (two) times daily.   olmesartan (BENICAR) 40 MG tablet TAKE 1 TABLET BY MOUTH ONCE A DAY.   pravastatin (PRAVACHOL) 20 MG tablet TAKE 1 TABLET(20 MG) BY MOUTH DAILY   No facility-administered encounter medications on file as of 09/03/2021.     Allergies (verified) Elemental sulfur   History: Past Medical History:  Diagnosis Date   CKD (chronic kidney disease) stage 3, GFR 30-59 ml/min (HCC)    HTN (hypertension)    Hyperlipidemia    History reviewed. No pertinent surgical history. Family History  Problem Relation Age of Onset   Hypertension Mother    Anuerysm Father    Hypotension Father    Social History   Socioeconomic History   Marital status: Married    Spouse name: Not on file   Number of children: Not on file   Years of education: Not on file   Highest education level: Not on file  Occupational History   Occupation: retired  Tobacco Use   Smoking status: Never    Passive exposure: Never   Smokeless tobacco: Never  Vaping Use   Vaping Use: Never used  Substance and Sexual Activity   Alcohol use: Never   Drug use: Never   Sexual activity: Not Currently  Other Topics Concern   Not on file  Social History Narrative   Not on file   Social Determinants of Health   Financial Resource Strain: Low Risk    Difficulty of Paying Living Expenses: Not hard at all  Food Insecurity: No Food Insecurity   Worried About Charity fundraiser in the Last Year: Never true   [place] in the Last Year: Never true  Transportation Needs: No Transportation Needs   Lack of Transportation (Medical): No   Lack of  Transportation (Non-Medical): No  Physical Activity: Inactive   Days of Exercise per Week: 0 days   Minutes of Exercise per Session: 0 min  Stress: No Stress Concern Present   Feeling of Stress : Not at all  Social Connections: Not on file    Tobacco Counseling Counseling given: Not Answered   Clinical Intake:  Pre-visit preparation completed: Yes  Pain : No/denies pain     Nutritional Status: BMI 25 -29 Overweight Nutritional Risks: None Diabetes: No  How often do you need to have someone help you when you read instructions, pamphlets, or other written materials from your doctor or  pharmacy?: 1 - Never What is the last grade level you completed in school?: 12th grade  Diabetic? no  Interpreter Needed?: No  Information entered by :: NAllen LPN   Activities of Daily Living In your present state of health, do you have any difficulty performing the following activities: 09/03/2021  Hearing? Y  Comment has hearing aides coming  Vision? N  Difficulty concentrating or making decisions? N  Walking or climbing stairs? N  Dressing or bathing? N  Doing errands, shopping? N  Preparing Food and eating ? N  Using the Toilet? N  In the past six months, have you accidently leaked urine? N  Do you have problems with loss of bowel control? N  Managing your Medications? N  Managing your Finances? N  Housekeeping or managing your Housekeeping? N  Some recent data might be hidden    Patient Care Team: Glendale Chard, MD as PCP - General (Internal Medicine)  Indicate any recent Medical Services you may have received from other than [facility] providers in the past year (date may be approximate).     Assessment:   This is a routine wellness examination for Amanda [name].  Hearing/Vision screen Vision Screening - Comments:: Regular eye exams, [facility]  Dietary issues and exercise activities discussed: Current Exercise Habits: The patient does not participate in regular exercise at present   Goals Addressed             This Visit's Progress    Patient Stated       09/03/2021, no goals       Depression Screen PHQ 2/9 Scores 09/03/2021 08/22/2020 08/16/2019 01/11/2019 07/12/2018  PHQ - 2 Score 0 0 0 0 0  PHQ- 9 Score - - 0 - -    Fall Risk Fall Risk  09/03/2021 08/22/2020 08/16/2019 01/31/2019 01/11/2019  Falls in the past year? 0 0 0 0 0  Comment - - - - -  Risk for fall due to : Medication side effect Medication side effect Medication side effect - -  Follow up Falls evaluation completed;Falls prevention discussed;Education provided Falls evaluation completed;Education  provided;Falls prevention discussed Falls evaluation completed;Education provided;Falls prevention discussed - -    FALL RISK PREVENTION PERTAINING TO THE HOME:  Any stairs in or around the home? Yes  If so, are there any without handrails? No  Home free of loose throw rugs in walkways, pet beds, electrical cords, etc? Yes  Adequate lighting in your home to reduce risk of falls? Yes   ASSISTIVE DEVICES UTILIZED TO PREVENT FALLS:  Life alert? No  Use of a cane, walker or w/c? No  Grab bars in the bathroom? Yes  Shower chair or bench in shower? No  Elevated toilet seat or a handicapped toilet? No   TIMED UP AND GO:  Was the test performed? No .     Gait steady  and fast without use of assistive device  Cognitive Function:     6CIT Screen 09/03/2021 08/22/2020 08/16/2019  What Year? 0 points 0 points 0 points  What month? 0 points 0 points 0 points  What time? 0 points 0 points 0 points  Count back from 20 0 points 0 points 0 points  Months in reverse 0 points 0 points 0 points  Repeat phrase 0 points 0 points 0 points  Total Score 0 0 0    Immunizations Immunization History  Administered Date(s) Administered   DTaP 06/26/2018   Fluad Quad(high Dose 65+) 05/29/2021   Influenza-Unspecified 06/27/2018, 05/25/2019   PFIZER(Purple Top)SARS-COV-2 Vaccination 09/24/2019, 10/20/2019, 07/12/2020   Pneumococcal-Unspecified 09/12/2012, 11/20/2013    TDAP status: Due, Education has been provided regarding the importance of this vaccine. Advised may receive this vaccine at local pharmacy or Health Dept. Aware to provide a copy of the vaccination record if obtained from local pharmacy or Health Dept. Verbalized acceptance and understanding.  Flu Vaccine status: Up to date  Pneumococcal vaccine status: Up to date  Covid-19 vaccine status: Completed vaccines  Qualifies for Shingles Vaccine? Yes   Zostavax completed No   Shingrix Completed?: No.    Education has been provided  regarding the importance of this vaccine. Patient has been advised to call insurance company to determine out of pocket expense if they have not yet received this vaccine. Advised may also receive vaccine at local pharmacy or Health Dept. Verbalized acceptance and understanding.  Screening Tests Health Maintenance  Topic Date Due   COVID-19 Vaccine (4 - Booster for Pfizer series) 09/19/2021 (Originally 09/06/2020)   Zoster Vaccines- Shingrix (1 of 2) 12/01/2021 (Originally 08/01/1959)   Pneumonia Vaccine 58+ Years old (1 - PCV) 09/03/2022 (Originally 08/02/2003)   TETANUS/TDAP  09/03/2022 (Originally 12/25/2019)   INFLUENZA VACCINE  Completed   DEXA SCAN  Completed   HPV VACCINES  Aged Out    Health Maintenance  There are no preventive care reminders to display for this patient.   Colorectal cancer screening: No longer required.   Mammogram status: No longer required due to age.  Bone Density status: Completed 01/30/2015  Lung Cancer Screening: (Low Dose CT Chest recommended if Age 18-80 years, 30 pack-year currently smoking OR have quit w/in 15years.) does not qualify.   Lung Cancer Screening Referral: no  Additional Screening:  Hepatitis C Screening: does not qualify;   Vision Screening: Recommended annual ophthalmology exams for early detection of glaucoma and other disorders of the eye. Is the patient up to date with their annual eye exam?  Yes  Who is the provider or what is the name of the office in which the patient attends annual eye exams? [facility] If pt is not established with a provider, would they like to be referred to a provider to establish care? No .   Dental Screening: Recommended annual dental exams for proper oral hygiene  Community Resource Referral / Chronic Care Management: CRR required this visit?  No   CCM required this visit?  No      Plan:     I have personally reviewed and noted the following in the patients chart:   Medical and social  history Use of alcohol, tobacco or illicit drugs  Current medications and supplements including opioid prescriptions.  Functional ability and status Nutritional status Physical activity Advanced directives List of other physicians Hospitalizations, surgeries, and ER visits in previous 12 months Vitals Screenings to include cognitive, depression, and falls Referrals and  appointments  In addition, I have reviewed and discussed with patient certain preventive protocols, quality metrics, and best practice recommendations. A written personalized care plan for preventive services as well as general preventive health recommendations were provided to patient.     Kellie Simmering, LPN   [id number]   Nurse Notes: none

## 2021-09-03 NOTE — Progress Notes (Signed)
Amanda [name],acting as a Neurosurgeon for Gwynneth Aliment, MD.,have documented all relevant documentation on the behalf of Gwynneth Aliment, MD,as directed by  Gwynneth Aliment, MD while in the presence of Gwynneth Aliment, MD.  This visit occurred during the SARS-CoV-2 public health emergency.  Safety protocols were in place, including screening questions prior to the visit, additional usage of staff PPE, and extensive cleaning of exam room while observing appropriate contact time as indicated for disinfecting solutions.  Subjective:     Patient ID: Amanda [name] , female    DOB: 29-Mar-1941 , 81 y.o.   MRN: [id number]   No chief complaint on file.   HPI  The patient is here today for a follow-up on high blood pressure and cholesterol. She reports compliance with meds. She denies headaches, chest pain and shortness of breath. She has not been seen since Jan 2022. She states she was "sick" at her last appt so she cancelled. She has not had any issues since her last visit.    She had her AWV performed by [facility] Advisor earlier today.   Hypertension This is a chronic problem. The current episode started more than 1 year ago. The problem has been gradually improving since onset. The problem is uncontrolled. Pertinent negatives include no blurred vision or sweats. Risk factors for coronary artery disease include dyslipidemia, post-menopausal state and sedentary lifestyle. Hypertensive end-organ damage includes kidney disease.    Past Medical History:  Diagnosis Date   CKD (chronic kidney disease) stage 3, GFR 30-59 ml/min (HCC)    HTN (hypertension)    Hyperlipidemia      Family History  Problem Relation Age of Onset   Hypertension Mother    Anuerysm Father    Hypotension Father      Current Outpatient Medications:    amLODipine (NORVASC) 10 MG tablet, Take 1 tablet (10 mg total) by mouth daily., Disp: 90 tablet, Rfl: 2   cetirizine (ZYRTEC) 10 MG tablet, Take 10 mg by mouth daily.,  Disp: , Rfl:    Cholecalciferol (VITAMIN D3 PO), Take 1,000 mg by mouth. , Disp: , Rfl:    Flaxseed, Linseed, (FLAXSEED OIL PO), Take by mouth., Disp: , Rfl:    hydrochlorothiazide (HYDRODIURIL) 25 MG tablet, TAKE 1 TABLET BY MOUTH DAILY WITH OLMESARTAN., Disp: 90 tablet, Rfl: 2   hydrocortisone (ANUSOL-HC) 2.5 % rectal cream, Place 1 application rectally 2 (two) times daily., Disp: 30 g, Rfl: 0   olmesartan (BENICAR) 40 MG tablet, Take 1 tablet (40 mg total) by mouth daily., Disp: 90 tablet, Rfl: 2   pravastatin (PRAVACHOL) 20 MG tablet, TAKE 1 TABLET(20 MG) BY MOUTH DAILY, Disp: 90 tablet, Rfl: 2   Allergies  Allergen Reactions   Elemental Sulfur Hives and Swelling     Review of Systems  Constitutional: Negative.   Eyes:  Negative for blurred vision.  Respiratory: Negative.    Cardiovascular: Negative.   Neurological: Negative.   Psychiatric/Behavioral: Negative.      Today's Vitals   09/03/21 0927  BP: (!) 146/64  Pulse: 96  Temp: 97.8 F (36.6 C)  Weight: 139 lb 5.3 oz (63.2 kg)  Height: 4' 10.8" (1.494 m)   Body mass index is 28.33 kg/m.  Wt Readings from Last 3 Encounters:  09/03/21 139 lb 5.3 oz (63.2 kg)  09/03/21 139 lb 6.4 oz (63.2 kg)  08/22/20 137 lb 9.6 oz (62.4 kg)     Objective:  Physical Exam Vitals and nursing note reviewed.  Constitutional:      Appearance: Normal appearance.  HENT:     Head: Normocephalic and atraumatic.     Nose:     Comments: Masked     Mouth/Throat:     Comments: Masked  Eyes:     Extraocular Movements: Extraocular movements intact.  Cardiovascular:     Rate and Rhythm: Normal rate and regular rhythm.     Heart sounds: Normal heart sounds.  Pulmonary:     Effort: Pulmonary effort is normal.     Breath sounds: Normal breath sounds.  Musculoskeletal:     Cervical back: Normal range of motion.  Skin:    General: Skin is warm.  Neurological:     General: No focal deficit present.     Mental Status: She is alert.   Psychiatric:        Mood and Affect: Mood normal.        Behavior: Behavior normal.        Assessment And Plan:     1. Hypertensive nephropathy Comments: Uncontrolled. She states home readings are 120s/60s. She does not wish to take additional meds or adjust current meds. Risks associated with uncontrolled BP was discussed with the patient in detail.  - CMP14+EGFR - CBC - Lipid panel  2. Stage 3a chronic kidney disease ([place]) Comments: Chronic, I will check Renal labs as below.  She declines referral to Renal because "of her age". I will also check renal ultrasound.  - PTH, intact and calcium - Phosphorus - Protein electrophoresis, serum - Vitamin D (25 hydroxy)  3. Pure hypercholesterolemia Comments: Chronic, currently on pravastatin. I will check lipid panel and LFTs today. She is encouraged to keep f/u appt in six months.  - CMP14+EGFR - Lipid panel  4. Other abnormal glucose Comments: Her a1c has been elevated in the past. I will recheck this today. She is encouraged to limit her intake of juices/sodas and other sugary beverages.  - Hemoglobin A1c  5. BMI 28.0-28.9,adult Comments: She is encouraged to aim for at least 150 minutes of exercise per week.    Patient was given opportunity to ask questions. Patient verbalized understanding of the plan and was able to repeat key elements of the plan. All questions were answered to their satisfaction.   I, Maximino Greenland, MD, have reviewed all documentation for this visit. The documentation on 09/03/21 for the exam, diagnosis, procedures, and orders are all accurate and complete.   IF YOU HAVE BEEN REFERRED TO A SPECIALIST, IT MAY TAKE 1-2 WEEKS TO SCHEDULE/PROCESS THE REFERRAL. IF YOU HAVE NOT HEARD FROM US/SPECIALIST IN TWO WEEKS, PLEASE GIVE [place] A CALL AT [phone].   THE PATIENT IS ENCOURAGED TO PRACTICE SOCIAL DISTANCING DUE TO THE COVID-19 PANDEMIC.

## 2021-09-03 NOTE — Patient Instructions (Signed)
[name] , Thank you for taking time to come for your Medicare Wellness Visit. I appreciate your ongoing commitment to your health goals. Please review the following plan we discussed and let me know if I can assist you in the future.   Screening recommendations/referrals: Colonoscopy: not required Mammogram: not required Bone Density: completed 01/30/2015 Recommended yearly ophthalmology/optometry visit for glaucoma screening and checkup Recommended yearly dental visit for hygiene and checkup  Vaccinations: Influenza vaccine: completed 05/29/2021, due next flu season Pneumococcal vaccine: decline Tdap vaccine: decline Shingles vaccine: decline   Covid-19: 07/12/2020, 10/20/2019, 09/24/2019  Advanced directives: Please bring a copy of your POA (Power of Attorney) and/or Living Will to your next appointment.   Conditions/risks identified: none  Next appointment: Follow up in one year for your annual wellness visit    Preventive Care 65 Years and Older, Female Preventive care refers to lifestyle choices and visits with your health care provider that can promote health and wellness. What does preventive care include? A yearly physical exam. This is also called an annual well check. Dental exams once or twice a year. Routine eye exams. Ask your health care provider how often you should have your eyes checked. Personal lifestyle choices, including: Daily care of your teeth and gums. Regular physical activity. Eating a healthy diet. Avoiding tobacco and drug use. Limiting alcohol use. Practicing safe sex. Taking low-dose aspirin every day. Taking vitamin and mineral supplements as recommended by your health care provider. What happens during an annual well check? The services and screenings done by your health care provider during your annual well check will depend on your age, overall health, lifestyle risk factors, and family history of disease. Counseling  Your health care  provider may ask you questions about your: Alcohol use. Tobacco use. Drug use. Emotional well-being. Home and relationship well-being. Sexual activity. Eating habits. History of falls. Memory and ability to understand (cognition). Work and work Astronomer. Reproductive health. Screening  You may have the following tests or measurements: Height, weight, and BMI. Blood pressure. Lipid and cholesterol levels. These may be checked every 5 years, or more frequently if you are over 33 years old. Skin check. Lung cancer screening. You may have this screening every year starting at age 49 if you have a 30-pack-year history of smoking and currently smoke or have quit within the past 15 years. Fecal occult blood test (FOBT) of the stool. You may have this test every year starting at age 3. Flexible sigmoidoscopy or colonoscopy. You may have a sigmoidoscopy every 5 years or a colonoscopy every 10 years starting at age 20. Hepatitis C blood test. Hepatitis B blood test. Sexually transmitted disease (STD) testing. Diabetes screening. This is done by checking your blood sugar (glucose) after you have not eaten for a while (fasting). You may have this done every 1-3 years. Bone density scan. This is done to screen for osteoporosis. You may have this done starting at age 4. Mammogram. This may be done every 1-2 years. Talk to your health care provider about how often you should have regular mammograms. Talk with your health care provider about your test results, treatment options, and if necessary, the need for more tests. Vaccines  Your health care provider may recommend certain vaccines, such as: Influenza vaccine. This is recommended every year. Tetanus, diphtheria, and acellular pertussis (Tdap, Td) vaccine. You may need a Td booster every 10 years. Zoster vaccine. You may need this after age 63. Pneumococcal 13-valent conjugate (PCV13) vaccine. One dose  is recommended after age  47. Pneumococcal polysaccharide (PPSV23) vaccine. One dose is recommended after age 104. Talk to your health care provider about which screenings and vaccines you need and how often you need them. This information is not intended to replace advice given to you by your health care provider. Make sure you discuss any questions you have with your health care provider. Document Released: 08/09/2015 Document Revised: 04/01/2016 Document Reviewed: 05/14/2015 Elsevier Interactive Patient Education  2017 [employer] Prevention in the Home Falls can cause injuries. They can happen to people of all ages. There are many things you can do to make your home safe and to help prevent falls. What can I do on the outside of my home? Regularly fix the edges of walkways and driveways and fix any cracks. Remove anything that might make you trip as you walk through a door, such as a raised step or threshold. Trim any bushes or trees on the path to your home. Use bright outdoor lighting. Clear any walking paths of anything that might make someone trip, such as rocks or tools. Regularly check to see if handrails are loose or broken. Make sure that both sides of any steps have handrails. Any raised decks and porches should have guardrails on the edges. Have any leaves, snow, or ice cleared regularly. Use sand or salt on walking paths during winter. Clean up any spills in your garage right away. This includes oil or grease spills. What can I do in the bathroom? Use night lights. Install grab bars by the toilet and in the tub and shower. Do not use towel bars as grab bars. Use non-skid mats or decals in the tub or shower. If you need to sit down in the shower, use a plastic, non-slip stool. Keep the floor dry. Clean up any water that spills on the floor as soon as it happens. Remove soap buildup in the tub or shower regularly. Attach bath mats securely with double-sided non-slip rug tape. Do not have throw  rugs and other things on the floor that can make you trip. What can I do in the bedroom? Use night lights. Make sure that you have a light by your bed that is easy to reach. Do not use any sheets or blankets that are too big for your bed. They should not hang down onto the floor. Have a firm chair that has side arms. You can use this for support while you get dressed. Do not have throw rugs and other things on the floor that can make you trip. What can I do in the kitchen? Clean up any spills right away. Avoid walking on wet floors. Keep items that you use a lot in easy-to-reach places. If you need to reach something above you, use a strong step stool that has a grab bar. Keep electrical cords out of the way. Do not use floor polish or wax that makes floors slippery. If you must use wax, use non-skid floor wax. Do not have throw rugs and other things on the floor that can make you trip. What can I do with my stairs? Do not leave any items on the stairs. Make sure that there are handrails on both sides of the stairs and use them. Fix handrails that are broken or loose. Make sure that handrails are as long as the stairways. Check any carpeting to make sure that it is firmly attached to the stairs. Fix any carpet that is loose or worn. Avoid  having throw rugs at the top or bottom of the stairs. If you do have throw rugs, attach them to the floor with carpet tape. Make sure that you have a light switch at the top of the stairs and the bottom of the stairs. If you do not have them, ask someone to add them for you. What else can I do to help prevent falls? Wear shoes that: Do not have high heels. Have rubber bottoms. Are comfortable and fit you well. Are closed at the toe. Do not wear sandals. If you use a stepladder: Make sure that it is fully opened. Do not climb a closed stepladder. Make sure that both sides of the stepladder are locked into place. Ask someone to hold it for you, if  possible. Clearly mark and make sure that you can see: Any grab bars or handrails. First and last steps. Where the edge of each step is. Use tools that help you move around (mobility aids) if they are needed. These include: Canes. Walkers. Scooters. Crutches. Turn on the lights when you go into a dark area. Replace any light bulbs as soon as they burn out. Set up your furniture so you have a clear path. Avoid moving your furniture around. If any of your floors are uneven, fix them. If there are any pets around you, be aware of where they are. Review your medicines with your doctor. Some medicines can make you feel dizzy. This can increase your chance of falling. Ask your doctor what other things that you can do to help prevent falls. This information is not intended to replace advice given to you by your health care provider. Make sure you discuss any questions you have with your health care provider. Document Released: 05/09/2009 Document Revised: 12/19/2015 Document Reviewed: 08/17/2014 Elsevier Interactive Patient Education  2017 [employer].

## 2021-09-03 NOTE — Patient Instructions (Signed)

## 2021-09-05 LAB — LIPID PANEL
Chol/HDL Ratio: 3 ratio (ref 0.0–4.4)
Cholesterol, Total: 183 mg/dL (ref 100–199)
HDL: 61 mg/dL (ref >39)
LDL Chol Calc (NIH): 103 mg/dL — ABNORMAL HIGH (ref 0–99)
Triglycerides: 104 mg/dL (ref 0–149)
VLDL Cholesterol Cal: 19 mg/dL (ref 5–40)

## 2021-09-05 LAB — CBC
Hematocrit: 42.8 % (ref 34.0–46.6)
Hemoglobin: 14.6 g/dL (ref 11.1–15.9)
MCH: 31.1 pg (ref 26.6–33.0)
MCHC: 34.1 g/dL (ref 31.5–35.7)
MCV: 91 fL (ref 79–97)
Platelets: 271 10*3/uL (ref 150–450)
RBC: 4.7 x10E6/uL (ref 3.77–5.28)
RDW: 12.4 % (ref 11.7–15.4)
WBC: 8.2 10*3/uL (ref 3.4–10.8)

## 2021-09-05 LAB — PTH, INTACT AND CALCIUM: PTH: 40 pg/mL (ref 15–65)

## 2021-09-05 LAB — PHOSPHORUS: Phosphorus: 2.8 mg/dL — ABNORMAL LOW (ref 3.0–4.3)

## 2021-09-05 LAB — CMP14+EGFR
ALT: 23 IU/L (ref 0–32)
AST: 28 IU/L (ref 0–40)
Albumin/Globulin Ratio: 1.8 (ref 1.2–2.2)
Albumin: 4.9 g/dL — ABNORMAL HIGH (ref 3.6–4.6)
Alkaline Phosphatase: 110 IU/L (ref 44–121)
BUN/Creatinine Ratio: 18 (ref 12–28)
BUN: 23 mg/dL (ref 8–27)
Bilirubin Total: 0.6 mg/dL (ref 0.0–1.2)
CO2: 22 mmol/L (ref 20–29)
Calcium: 10 mg/dL (ref 8.7–10.3)
Chloride: 98 mmol/L (ref 96–106)
Creatinine, Ser: 1.26 mg/dL — ABNORMAL HIGH (ref 0.57–1.00)
Globulin, Total: 2.7 g/dL (ref 1.5–4.5)
Glucose: 132 mg/dL — ABNORMAL HIGH (ref 70–99)
Potassium: 4.6 mmol/L (ref 3.5–5.2)
Sodium: 137 mmol/L (ref 134–144)
Total Protein: 7.6 g/dL (ref 6.0–8.5)
eGFR: 42 mL/min/{1.73_m2} — ABNORMAL LOW (ref >59)

## 2021-09-05 LAB — PROTEIN ELECTROPHORESIS, SERUM
A/G Ratio: 1.2 (ref 0.7–1.7)
Albumin ELP: 4.2 g/dL (ref 2.9–4.4)
Alpha 1: 0.3 g/dL (ref 0.0–0.4)
Alpha 2: 1 g/dL (ref 0.4–1.0)
Beta: 1 g/dL (ref 0.7–1.3)
Gamma Globulin: 1.2 g/dL (ref 0.4–1.8)
Globulin, Total: 3.4 g/dL (ref 2.2–3.9)

## 2021-09-05 LAB — HEMOGLOBIN A1C
Est. average glucose Bld gHb Est-mCnc: 123 mg/dL
Hgb A1c MFr Bld: 5.9 % — ABNORMAL HIGH (ref 4.8–5.6)

## 2021-09-05 LAB — VITAMIN D 25 HYDROXY (VIT D DEFICIENCY, FRACTURES): Vit D, 25-Hydroxy: 47.1 ng/mL (ref 30.0–100.0)

## 2021-12-29 DIAGNOSIS — S3992XA Unspecified injury of lower back, initial encounter: Secondary | ICD-10-CM | POA: Diagnosis not present

## 2021-12-31 DIAGNOSIS — M545 Low back pain, unspecified: Secondary | ICD-10-CM | POA: Diagnosis not present

## 2022-01-07 DIAGNOSIS — M5459 Other low back pain: Secondary | ICD-10-CM | POA: Diagnosis not present

## 2022-01-07 DIAGNOSIS — M8000XA Age-related osteoporosis with current pathological fracture, unspecified site, initial encounter for fracture: Secondary | ICD-10-CM | POA: Diagnosis not present

## 2022-01-08 DIAGNOSIS — M5459 Other low back pain: Secondary | ICD-10-CM | POA: Diagnosis not present

## 2022-01-09 DIAGNOSIS — M5451 Vertebrogenic low back pain: Secondary | ICD-10-CM | POA: Diagnosis not present

## 2022-01-12 ENCOUNTER — Ambulatory Visit (INDEPENDENT_AMBULATORY_CARE_PROVIDER_SITE_OTHER): Payer: Medicare Other | Admitting: Nurse Practitioner

## 2022-01-12 ENCOUNTER — Encounter: Payer: Self-pay | Admitting: Nurse Practitioner

## 2022-01-12 VITALS — BP 136/64 | HR 97 | Temp 98.1°F

## 2022-01-12 DIAGNOSIS — Z01818 Encounter for other preprocedural examination: Secondary | ICD-10-CM

## 2022-01-12 DIAGNOSIS — N183 Chronic kidney disease, stage 3 unspecified: Secondary | ICD-10-CM | POA: Diagnosis not present

## 2022-01-12 DIAGNOSIS — I129 Hypertensive chronic kidney disease with stage 1 through stage 4 chronic kidney disease, or unspecified chronic kidney disease: Secondary | ICD-10-CM | POA: Diagnosis not present

## 2022-01-12 DIAGNOSIS — M545 Low back pain, unspecified: Secondary | ICD-10-CM | POA: Diagnosis not present

## 2022-01-12 NOTE — Progress Notes (Signed)
I,Tianna Badgett,acting as a Education administrator for [employer], FNP.,have documented all relevant documentation on the behalf of Minette Brine, FNP,as directed by  Minette Brine, FNP while in the presence of Minette Brine, [place].  This visit occurred during the SARS-CoV-2 public health emergency.  Safety protocols were in place, including screening questions prior to the visit, additional usage of staff PPE, and extensive cleaning of exam room while observing appropriate contact time as indicated for disinfecting solutions.  Subjective:     Patient ID: Amanda [name] , female    DOB: 1941/04/02 , 81 y.o.   MRN: [id number]   Chief Complaint  Patient presents with   Pre-op Exam    HPI  Patient presents today for pre-op evaluation. She was lifting a Conservation officer, nature 2 weeks ago, her and her husband heard a pop. She has been to Dr. Rolena Infante who is planning to do surgery.  Denies numbness or tingling. She is unable to lay or sit down for long periods.      Past Medical History:  Diagnosis Date   CKD (chronic kidney disease) stage 3, GFR 30-59 ml/min (HCC)    HTN (hypertension)    Hyperlipidemia      Family History  Problem Relation Age of Onset   Hypertension Mother    Anuerysm Father    Hypotension Father      Current Outpatient Medications:    amLODipine (NORVASC) 10 MG tablet, Take 1 tablet (10 mg total) by mouth daily., Disp: 90 tablet, Rfl: 2   cetirizine (ZYRTEC) 10 MG tablet, Take 10 mg by mouth daily., Disp: , Rfl:    Cholecalciferol (VITAMIN D3 PO), Take 1,000 mg by mouth. , Disp: , Rfl:    Flaxseed, Linseed, (FLAXSEED OIL PO), Take by mouth., Disp: , Rfl:    hydrochlorothiazide (HYDRODIURIL) 25 MG tablet, TAKE 1 TABLET BY MOUTH DAILY WITH OLMESARTAN., Disp: 90 tablet, Rfl: 2   hydrocortisone (ANUSOL-HC) 2.5 % rectal cream, Place 1 application rectally 2 (two) times daily., Disp: 30 g, Rfl: 0   olmesartan (BENICAR) 40 MG tablet, Take 1 tablet (40 mg total) by mouth daily., Disp: 90 tablet,  Rfl: 2   pravastatin (PRAVACHOL) 20 MG tablet, TAKE 1 TABLET(20 MG) BY MOUTH DAILY, Disp: 90 tablet, Rfl: 2   Allergies  Allergen Reactions   Elemental Sulfur Hives and Swelling     Review of Systems  Constitutional: Negative.   Respiratory: Negative.    Musculoskeletal:  Positive for back pain.  Psychiatric/Behavioral: Negative.       Today's Vitals   01/12/22 1147  BP: 136/64  Pulse: 97  Temp: 98.1 F (36.7 C)  TempSrc: Oral   There is no height or weight on file to calculate BMI.   Objective:  Physical Exam Constitutional:      General: She is not in acute distress.    Appearance: Normal appearance.  Cardiovascular:     Rate and Rhythm: Normal rate and regular rhythm.     Pulses: Normal pulses.     Heart sounds: Normal heart sounds. No murmur heard. Pulmonary:     Effort: Pulmonary effort is normal. No respiratory distress.     Breath sounds: Normal breath sounds.  Skin:    General: Skin is warm and dry.     Capillary Refill: Capillary refill takes less than 2 seconds.  Neurological:     General: No focal deficit present.     Mental Status: She is alert and oriented to person, place, and time.  Cranial Nerves: No cranial nerve deficit.     Motor: No weakness.  Psychiatric:        Mood and Affect: Mood normal.        Behavior: Behavior normal.        Thought Content: Thought content normal.        Judgment: Judgment normal.         Assessment And Plan:     1. Lumbar back pain Comments: Seen by Dr. Brooks and was told had a fracture. I do not have the notes from his visit. Will complete form for her upcoming surgery  2. Pre-op examination Comments: No scheduled surgery for L2 Kyphoplasty. EKG done SR HR 97. Pending labs will clear for surgery - EKG 12-Lead - BMP8+eGFR - CBC  3. Hypertensive nephropathy Comments: Blood pressure is well controlled, continue current medications. - BMP8+eGFR  4. Stage 3 chronic kidney disease, unspecified whether  stage 3a or 3b CKD (HCC) - BMP8+eGFR     Patient was given opportunity to ask questions. Patient verbalized understanding of the plan and was able to repeat key elements of the plan. All questions were answered to their satisfaction.  [name], FNP   I, [name], FNP, have reviewed all documentation for this visit. The documentation on 01/12/22 for the exam, diagnosis, procedures, and orders are all accurate and complete.   IF YOU HAVE BEEN REFERRED TO A SPECIALIST, IT MAY TAKE 1-2 WEEKS TO SCHEDULE/PROCESS THE REFERRAL. IF YOU HAVE NOT HEARD FROM US/SPECIALIST IN TWO WEEKS, PLEASE GIVE US A CALL AT [phone].   THE PATIENT IS ENCOURAGED TO PRACTICE SOCIAL DISTANCING DUE TO THE COVID-19 PANDEMIC.   

## 2022-01-12 NOTE — Patient Instructions (Signed)
Preparing for Back Surgery, Adult Back surgery is often needed if your back pain has not been relieved by other nonsurgical treatments. It is important to prepare for back surgery physically and mentally. With any surgery, there are potential risks and complications. There is also a recovery period. Knowing what to expect and making arrangements ahead of time can lessen your risk of complications and make recovery go more smoothly. Tell a health care provider about: Any allergies you have. All medicines you are taking, including vitamins, herbs, eye drops, creams, and over-the-counter medicines. Any problems you or family members have had with anesthetic medicines. Any blood disorders you have. Any surgeries you have had. Any medical conditions you have. Whether you are pregnant or may be pregnant. What happens before the procedure? Tests Your surgeon may do routine tests to make sure it is safe for you to have surgery. These may include: Blood tests. A chest X-ray. An electrocardiogram (ECG). Doctor visits If you have any medical problems, you may need to see your primary health care provider. If you have any heart problems, you may need to see a cardiologist. Medicine review Ask your health care provider about: Changing or stopping your regular medicines. This is especially important if you are taking diabetes medicines or blood thinners. Taking medicines such as aspirin and ibuprofen. These medicines can thin your blood. Do not take these medicines unless your health care provider tells you to take them. Taking over-the-counter medicines, vitamins, herbs, and supplements.  Quitting smoking  If you smoke, quit as soon as you can before surgery. If there is time, it is best to quit several months before surgery. Tell your surgeon if you use any products that contain nicotine or tobacco, such as cigarettes, e-cigarettes, and chewing tobacco. These can delay healing. If you need help  quitting, ask your health care provider. Blood transfusion preparation Talk to your surgeon about the possibility of needing a blood transfusion during your surgery. To prepare: You may be able to donate your own blood instead of using donated blood from a blood bank. Your surgeon may have you take iron supplements to build up your blood. You will have to sign a blood donation consent form. Anesthesia options Ask your surgeon about your anesthesia options for back surgery. These may include being put to sleep (general anesthesia) or having an injection into your spine (spinal anesthesia), which will make you numb below the injection and a little above it. Talk to your surgeon about the risks and benefits of each. You may be able to meet with an anesthesia care provider before surgery. If you have spinal anesthesia, you will also get medicine through your IV to help you relax (IV sedation). If you have general anesthesia, you will have to follow instructions from your health care provider about eating and drinking, which may include: 8 hours before the procedure - stop eating heavy meals or foods, such as meat, fried foods, or fatty foods. 6 hours before the procedure - stop eating light meals or foods, such as toast or cereal. 6 hours before the procedure - stop drinking milk or drinks that contain milk. 2 hours before the procedure - stop drinking clear liquids.  Recovery planning Plan to have someone take you home from the hospital. Plan to have extra help at home. You may not be able to drive for about 2 weeks. Plan to have someone drive you on errands and to appointments. Get your home ready for recovery: Make and freeze  meals ahead of time. Remove all clutter from your floors to avoid falls. Place the items you will need within easy reach. Have night-lights in your bedroom and hallway. Consent form preparation Before signing the consent form: Discuss all the risks and benefits of the  procedure with your surgeon. Talk about what to expect during recovery. Ask about how your pain will be managed. Make sure you understand all possible complications and what to watch out for. Summary Good preparation before surgery can lessen your risks and complications and make recovery go more smoothly. If you smoke, quit as soon as you can before surgery. Start planning for your recovery at home before surgery. Get your home ready so it is a safe and clutter-free place to recover. Make sure you understand all possible complications and what to watch out for. This information is not intended to replace advice given to you by your health care provider. Make sure you discuss any questions you have with your health care provider. Document Revised: 05/11/2019 Document Reviewed: 02/01/2019 Elsevier Patient Education  2023 [employer].

## 2022-01-13 ENCOUNTER — Other Ambulatory Visit: Payer: Self-pay | Admitting: Nurse Practitioner

## 2022-01-13 DIAGNOSIS — N1831 Chronic kidney disease, stage 3a: Secondary | ICD-10-CM

## 2022-01-13 LAB — BMP8+EGFR
BUN/Creatinine Ratio: 30 — ABNORMAL HIGH (ref 12–28)
BUN: 56 mg/dL — ABNORMAL HIGH (ref 8–27)
CO2: 18 mmol/L — ABNORMAL LOW (ref 20–29)
Calcium: 10 mg/dL (ref 8.7–10.3)
Chloride: 93 mmol/L — ABNORMAL LOW (ref 96–106)
Creatinine, Ser: 1.89 mg/dL — ABNORMAL HIGH (ref 0.57–1.00)
Glucose: 104 mg/dL — ABNORMAL HIGH (ref 70–99)
Potassium: 4.2 mmol/L (ref 3.5–5.2)
Sodium: 132 mmol/L — ABNORMAL LOW (ref 134–144)
eGFR: 26 mL/min/{1.73_m2} — ABNORMAL LOW (ref >59)

## 2022-01-13 LAB — CBC
Hematocrit: 42.9 % (ref 34.0–46.6)
Hemoglobin: 14.4 g/dL (ref 11.1–15.9)
MCH: 30.6 pg (ref 26.6–33.0)
MCHC: 33.6 g/dL (ref 31.5–35.7)
MCV: 91 fL (ref 79–97)
Platelets: 455 10*3/uL — ABNORMAL HIGH (ref 150–450)
RBC: 4.71 x10E6/uL (ref 3.77–5.28)
RDW: 12.4 % (ref 11.7–15.4)
WBC: 10.9 10*3/uL — ABNORMAL HIGH (ref 3.4–10.8)

## 2022-01-13 MED ORDER — HYDRALAZINE HCL 10 MG PO TABS: 10.0000 mg | ORAL_TABLET | Freq: Two times a day (BID) | ORAL | 2 refills | Status: AC

## 2022-01-13 MED ORDER — OLMESARTAN MEDOXOMIL 20 MG PO TABS: 20.0000 mg | ORAL_TABLET | Freq: Every day | ORAL | 1 refills | Status: AC

## 2022-01-20 ENCOUNTER — Other Ambulatory Visit: Payer: Self-pay | Admitting: Nurse Practitioner

## 2022-01-20 ENCOUNTER — Ambulatory Visit: Payer: Medicare Other

## 2022-01-20 VITALS — BP 144/70 | HR 92 | Temp 97.6°F | Ht 58.8 in | Wt 139.0 lb

## 2022-01-20 DIAGNOSIS — I129 Hypertensive chronic kidney disease with stage 1 through stage 4 chronic kidney disease, or unspecified chronic kidney disease: Secondary | ICD-10-CM | POA: Diagnosis not present

## 2022-01-20 LAB — BMP8+EGFR
BUN/Creatinine Ratio: 19 (ref 12–28)
BUN: 19 mg/dL (ref 8–27)
CO2: 23 mmol/L (ref 20–29)
Calcium: 9.7 mg/dL (ref 8.7–10.3)
Chloride: 97 mmol/L (ref 96–106)
Creatinine, Ser: 0.98 mg/dL (ref 0.57–1.00)
Glucose: 111 mg/dL — ABNORMAL HIGH (ref 70–99)
Potassium: 3.8 mmol/L (ref 3.5–5.2)
Sodium: 136 mmol/L (ref 134–144)
eGFR: 57 mL/min/{1.73_m2} — ABNORMAL LOW (ref >59)

## 2022-01-20 MED ORDER — TRAMADOL HCL 50 MG PO TABS: 50.0000 mg | ORAL_TABLET | Freq: Four times a day (QID) | ORAL | 0 refills | Status: AC | PRN

## 2022-01-21 ENCOUNTER — Encounter: Payer: Self-pay | Admitting: Nurse Practitioner

## 2022-02-13 DIAGNOSIS — M8008XA Age-related osteoporosis with current pathological fracture, vertebra(e), initial encounter for fracture: Secondary | ICD-10-CM | POA: Diagnosis not present

## 2022-03-04 ENCOUNTER — Ambulatory Visit: Payer: Medicare Other | Admitting: Internal Medicine

## 2022-03-27 DIAGNOSIS — Z4889 Encounter for other specified surgical aftercare: Secondary | ICD-10-CM | POA: Diagnosis not present

## 2022-04-15 DIAGNOSIS — M5451 Vertebrogenic low back pain: Secondary | ICD-10-CM | POA: Diagnosis not present

## 2022-04-17 DIAGNOSIS — M5451 Vertebrogenic low back pain: Secondary | ICD-10-CM | POA: Diagnosis not present

## 2022-04-20 DIAGNOSIS — M5451 Vertebrogenic low back pain: Secondary | ICD-10-CM | POA: Diagnosis not present

## 2022-04-24 DIAGNOSIS — M5451 Vertebrogenic low back pain: Secondary | ICD-10-CM | POA: Diagnosis not present

## 2022-04-27 DIAGNOSIS — M5451 Vertebrogenic low back pain: Secondary | ICD-10-CM | POA: Diagnosis not present

## 2022-04-29 DIAGNOSIS — M5451 Vertebrogenic low back pain: Secondary | ICD-10-CM | POA: Diagnosis not present

## 2022-05-05 DIAGNOSIS — M5451 Vertebrogenic low back pain: Secondary | ICD-10-CM | POA: Diagnosis not present

## 2022-05-06 ENCOUNTER — Other Ambulatory Visit: Payer: Self-pay | Admitting: Internal Medicine

## 2022-05-07 DIAGNOSIS — M5451 Vertebrogenic low back pain: Secondary | ICD-10-CM | POA: Diagnosis not present

## 2022-05-12 DIAGNOSIS — M5451 Vertebrogenic low back pain: Secondary | ICD-10-CM | POA: Diagnosis not present

## 2022-05-14 DIAGNOSIS — M5451 Vertebrogenic low back pain: Secondary | ICD-10-CM | POA: Diagnosis not present

## 2022-05-19 DIAGNOSIS — M5459 Other low back pain: Secondary | ICD-10-CM | POA: Diagnosis not present

## 2022-05-19 DIAGNOSIS — M8000XA Age-related osteoporosis with current pathological fracture, unspecified site, initial encounter for fracture: Secondary | ICD-10-CM | POA: Diagnosis not present

## 2022-05-26 DIAGNOSIS — M8000XA Age-related osteoporosis with current pathological fracture, unspecified site, initial encounter for fracture: Secondary | ICD-10-CM | POA: Diagnosis not present

## 2022-05-26 DIAGNOSIS — M5459 Other low back pain: Secondary | ICD-10-CM | POA: Diagnosis not present

## 2022-05-29 DIAGNOSIS — M5451 Vertebrogenic low back pain: Secondary | ICD-10-CM | POA: Diagnosis not present

## 2022-06-02 DIAGNOSIS — M5451 Vertebrogenic low back pain: Secondary | ICD-10-CM | POA: Diagnosis not present

## 2022-06-04 DIAGNOSIS — M5451 Vertebrogenic low back pain: Secondary | ICD-10-CM | POA: Diagnosis not present

## 2022-06-09 DIAGNOSIS — M5451 Vertebrogenic low back pain: Secondary | ICD-10-CM | POA: Diagnosis not present

## 2022-06-11 DIAGNOSIS — M5451 Vertebrogenic low back pain: Secondary | ICD-10-CM | POA: Diagnosis not present

## 2022-06-21 ENCOUNTER — Other Ambulatory Visit: Payer: Self-pay | Admitting: Internal Medicine

## 2022-06-29 ENCOUNTER — Other Ambulatory Visit: Payer: Self-pay | Admitting: Internal Medicine

## 2022-09-02 DIAGNOSIS — H5203 Hypermetropia, bilateral: Secondary | ICD-10-CM | POA: Diagnosis not present

## 2022-09-02 DIAGNOSIS — H2513 Age-related nuclear cataract, bilateral: Secondary | ICD-10-CM | POA: Diagnosis not present

## 2022-09-17 ENCOUNTER — Ambulatory Visit: Payer: Medicare Other

## 2022-09-17 ENCOUNTER — Ambulatory Visit: Payer: Medicare Other | Admitting: Internal Medicine

## 2022-09-17 DIAGNOSIS — H269 Unspecified cataract: Secondary | ICD-10-CM | POA: Diagnosis not present

## 2022-09-17 DIAGNOSIS — H2511 Age-related nuclear cataract, right eye: Secondary | ICD-10-CM | POA: Diagnosis not present

## 2022-09-17 DIAGNOSIS — H268 Other specified cataract: Secondary | ICD-10-CM | POA: Diagnosis not present

## 2022-10-01 DIAGNOSIS — H2512 Age-related nuclear cataract, left eye: Secondary | ICD-10-CM | POA: Diagnosis not present

## 2022-10-01 DIAGNOSIS — H269 Unspecified cataract: Secondary | ICD-10-CM | POA: Diagnosis not present

## 2022-10-21 ENCOUNTER — Telehealth: Payer: Self-pay | Admitting: Internal Medicine

## 2022-10-21 NOTE — Telephone Encounter (Signed)
Contacted Amanda [name] to schedule their annual wellness visit. Appointment made for 10/22/22.  Amanda [name] AWV direct phone # [phone]

## 2022-10-22 ENCOUNTER — Ambulatory Visit (INDEPENDENT_AMBULATORY_CARE_PROVIDER_SITE_OTHER): Payer: Medicare Other

## 2022-10-22 VITALS — Ht 59.5 in | Wt 136.0 lb

## 2022-10-22 DIAGNOSIS — Z0000 Encounter for general adult medical examination without abnormal findings: Secondary | ICD-10-CM

## 2022-10-22 NOTE — Patient Instructions (Signed)
[name] , Thank you for taking time to come for your Medicare Wellness Visit. I appreciate your ongoing commitment to your health goals. Please review the following plan we discussed and let me know if I can assist you in the future.   These are the goals we discussed:  Goals      DIET - INCREASE WATER INTAKE     Increase physical activity     Patient Stated     08/22/2020, wants to weigh 130 pounds     Patient Stated     09/03/2021, no goals     Patient Stated     10/22/2022, wants to lose weight     Weight (lb) < 200 lb (90.7 kg)     08/16/2019, wants to get get down to 125 pounds        This is a list of the screening recommended for you and due dates:  Health Maintenance  Topic Date Due   Zoster (Shingles) Vaccine (1 of 2) Never done   Pneumonia Vaccine (1 of 1 - PCV) 08/02/2003   Flu Shot  02/24/2022   COVID-19 Vaccine (4 - 2023-24 season) 03/27/2022   Medicare Annual Wellness Visit  10/22/2023   DTaP/Tdap/Td vaccine (2 - Tdap) 06/26/2028   DEXA scan (bone density measurement)  Completed   HPV Vaccine  Aged Out    Advanced directives: Please bring a copy of your POA (Power of [place]) and/or Living Will to your next appointment.   Conditions/risks identified: none  Next appointment: Follow up in one year for your annual wellness visit    Preventive Care 65 Years and Older, Female Preventive care refers to lifestyle choices and visits with your health care provider that can promote health and wellness. What does preventive care include? A yearly physical exam. This is also called an annual well check. Dental exams once or twice a year. Routine eye exams. Ask your health care provider how often you should have your eyes checked. Personal lifestyle choices, including: Daily care of your teeth and gums. Regular physical activity. Eating a healthy diet. Avoiding tobacco and drug use. Limiting alcohol use. Practicing safe sex. Taking low-dose aspirin every  day. Taking vitamin and mineral supplements as recommended by your health care provider. What happens during an annual well check? The services and screenings done by your health care provider during your annual well check will depend on your age, overall health, lifestyle risk factors, and family history of disease. Counseling  Your health care provider may ask you questions about your: Alcohol use. Tobacco use. Drug use. Emotional well-being. Home and relationship well-being. Sexual activity. Eating habits. History of falls. Memory and ability to understand (cognition). Work and work Statistician. Reproductive health. Screening  You may have the following tests or measurements: Height, weight, and BMI. Blood pressure. Lipid and cholesterol levels. These may be checked every 5 years, or more frequently if you are over 9 years old. Skin check. Lung cancer screening. You may have this screening every year starting at age 77 if you have a 30-pack-year history of smoking and currently smoke or have quit within the past 15 years. Fecal occult blood test (FOBT) of the stool. You may have this test every year starting at age 5. Flexible sigmoidoscopy or colonoscopy. You may have a sigmoidoscopy every 5 years or a colonoscopy every 10 years starting at age 16. Hepatitis C blood test. Hepatitis B blood test. Sexually transmitted disease (STD) testing. Diabetes screening. This is done by  checking your blood sugar (glucose) after you have not eaten for a while (fasting). You may have this done every 1-3 years. Bone density scan. This is done to screen for osteoporosis. You may have this done starting at age 48. Mammogram. This may be done every 1-2 years. Talk to your health care provider about how often you should have regular mammograms. Talk with your health care provider about your test results, treatment options, and if necessary, the need for more tests. Vaccines  Your health care  provider may recommend certain vaccines, such as: Influenza vaccine. This is recommended every year. Tetanus, diphtheria, and acellular pertussis (Tdap, Td) vaccine. You may need a Td booster every 10 years. Zoster vaccine. You may need this after age 80. Pneumococcal 13-valent conjugate (PCV13) vaccine. One dose is recommended after age 1. Pneumococcal polysaccharide (PPSV23) vaccine. One dose is recommended after age 35. Talk to your health care provider about which screenings and vaccines you need and how often you need them. This information is not intended to replace advice given to you by your health care provider. Make sure you discuss any questions you have with your health care provider. Document Released: 08/09/2015 Document Revised: 04/01/2016 Document Reviewed: 05/14/2015 Elsevier Interactive Patient Education  2017 [employer] Prevention in the Home Falls can cause injuries. They can happen to people of all ages. There are many things you can do to make your home safe and to help prevent falls. What can I do on the outside of my home? Regularly fix the edges of walkways and driveways and fix any cracks. Remove anything that might make you trip as you walk through a door, such as a raised step or threshold. Trim any bushes or trees on the path to your home. Use bright outdoor lighting. Clear any walking paths of anything that might make someone trip, such as rocks or tools. Regularly check to see if handrails are loose or broken. Make sure that both sides of any steps have handrails. Any raised decks and porches should have guardrails on the edges. Have any leaves, snow, or ice cleared regularly. Use sand or salt on walking paths during winter. Clean up any spills in your garage right away. This includes oil or grease spills. What can I do in the bathroom? Use night lights. Install grab bars by the toilet and in the tub and shower. Do not use towel bars as grab  bars. Use non-skid mats or decals in the tub or shower. If you need to sit down in the shower, use a plastic, non-slip stool. Keep the floor dry. Clean up any water that spills on the floor as soon as it happens. Remove soap buildup in the tub or shower regularly. Attach bath mats securely with double-sided non-slip rug tape. Do not have throw rugs and other things on the floor that can make you trip. What can I do in the bedroom? Use night lights. Make sure that you have a light by your bed that is easy to reach. Do not use any sheets or blankets that are too big for your bed. They should not hang down onto the floor. Have a firm chair that has side arms. You can use this for support while you get dressed. Do not have throw rugs and other things on the floor that can make you trip. What can I do in the kitchen? Clean up any spills right away. Avoid walking on wet floors. Keep items that you use a  lot in easy-to-reach places. If you need to reach something above you, use a strong step stool that has a grab bar. Keep electrical cords out of the way. Do not use floor polish or wax that makes floors slippery. If you must use wax, use non-skid floor wax. Do not have throw rugs and other things on the floor that can make you trip. What can I do with my stairs? Do not leave any items on the stairs. Make sure that there are handrails on both sides of the stairs and use them. Fix handrails that are broken or loose. Make sure that handrails are as long as the stairways. Check any carpeting to make sure that it is firmly attached to the stairs. Fix any carpet that is loose or worn. Avoid having throw rugs at the top or bottom of the stairs. If you do have throw rugs, attach them to the floor with carpet tape. Make sure that you have a light switch at the top of the stairs and the bottom of the stairs. If you do not have them, ask someone to add them for you. What else can I do to help prevent  falls? Wear shoes that: Do not have high heels. Have rubber bottoms. Are comfortable and fit you well. Are closed at the toe. Do not wear sandals. If you use a stepladder: Make sure that it is fully opened. Do not climb a closed stepladder. Make sure that both sides of the stepladder are locked into place. Ask someone to hold it for you, if possible. Clearly mark and make sure that you can see: Any grab bars or handrails. First and last steps. Where the edge of each step is. Use tools that help you move around (mobility aids) if they are needed. These include: Canes. Walkers. Scooters. Crutches. Turn on the lights when you go into a dark area. Replace any light bulbs as soon as they burn out. Set up your furniture so you have a clear path. Avoid moving your furniture around. If any of your floors are uneven, fix them. If there are any pets around you, be aware of where they are. Review your medicines with your doctor. Some medicines can make you feel dizzy. This can increase your chance of falling. Ask your doctor what other things that you can do to help prevent falls. This information is not intended to replace advice given to you by your health care provider. Make sure you discuss any questions you have with your health care provider. Document Released: 05/09/2009 Document Revised: 12/19/2015 Document Reviewed: 08/17/2014 Elsevier Interactive Patient Education  2017 [employer].

## 2022-10-22 NOTE — Progress Notes (Signed)
I connected with  [name] on 10/22/22 by a audio enabled telemedicine application and verified that I am speaking with the correct person using two identifiers.  Patient Location: Home  Provider Location: Office/Clinic  I discussed the limitations of evaluation and management by telemedicine. The patient expressed understanding and agreed to proceed.  Subjective:   Amanda [name] is a 82 y.o. female who presents for Medicare Annual (Subsequent) preventive examination.  Review of Systems     Cardiac Risk Factors include: advanced age (>28men, >6 women);hypertension     Objective:    Today's Vitals   10/22/22 0811  Weight: 136 lb (61.7 kg)  Height: 4' 11.5" (1.511 m)   Body mass index is 27.01 kg/m.     10/22/2022    8:15 AM 09/03/2021    9:19 AM 08/22/2020   11:06 AM 08/16/2019   12:28 PM  Advanced Directives  Does Patient Have a Medical Advance Directive? Yes Yes Yes Yes  Type of Paramedic of [place];Living will [place];Living will [place];Living will [place];Living will  Copy of [place] in Chart? No - copy requested No - copy requested No - copy requested No - copy requested    Current Medications (verified) Outpatient Encounter Medications as of 10/22/2022  Medication Sig   amLODipine (NORVASC) 10 MG tablet Take 1 tablet (10 mg total) by mouth daily.   cetirizine (ZYRTEC) 10 MG tablet Take 10 mg by mouth daily.   Cholecalciferol (VITAMIN D3 PO) Take 1,000 mg by mouth.    Flaxseed, Linseed, (FLAXSEED OIL PO) Take by mouth.   hydrALAZINE (APRESOLINE) 10 MG tablet Take 1 tablet (10 mg total) by mouth 2 (two) times daily.   hydrochlorothiazide (HYDRODIURIL) 25 MG tablet TAKE 1 TABLET BY MOUTH EVERY DAY WITH OLMESARTAN   hydrocortisone (ANUSOL-HC) 2.5 % rectal cream Place 1 application rectally 2 (two) times daily.   olmesartan (BENICAR) 40 MG tablet  TAKE 1 TABLET(40 MG) BY MOUTH DAILY   pravastatin (PRAVACHOL) 20 MG tablet TAKE 1 TABLET(20 MG) BY MOUTH DAILY   traMADol (ULTRAM) 50 MG tablet Take 1 tablet (50 mg total) by mouth every 6 (six) hours as needed.   olmesartan (BENICAR) 20 MG tablet Take 1 tablet (20 mg total) by mouth daily.   No facility-administered encounter medications on file as of 10/22/2022.    Allergies (verified) Elemental sulfur   History: Past Medical History:  Diagnosis Date   Allergy    Arthritis    CKD (chronic kidney disease) stage 3, GFR 30-59 ml/min (HCC)    HTN (hypertension)    Hyperlipidemia    Osteoporosis    Past Surgical History:  Procedure Laterality Date   EYE SURGERY  2024   Family History  Problem Relation Age of Onset   Hypertension Mother    Anuerysm Father    Hypotension Father    Social History   Socioeconomic History   Marital status: Married    Spouse name: Not on file   Number of children: Not on file   Years of education: Not on file   Highest education level: Not on file  Occupational History   Occupation: retired  Tobacco Use   Smoking status: Never    Passive exposure: Never   Smokeless tobacco: Never  Vaping Use   Vaping Use: Never used  Substance and Sexual Activity   Alcohol use: Never   Drug use: Never   Sexual activity:  Not Currently    Birth control/protection: None  Other Topics Concern   Not on file  Social History Narrative   Not on file   Social Determinants of Health   Financial Resource Strain: Low Risk  (10/22/2022)   Overall Financial Resource Strain (CARDIA)    Difficulty of Paying Living Expenses: Not hard at all  Food Insecurity: No Food Insecurity (10/22/2022)   Hunger Vital Sign    Worried About Running Out of Food in the Last Year: Never true    Ran Out of Food in the Last Year: Never true  Transportation Needs: No Transportation Needs (10/22/2022)   PRAPARE - Hydrologist (Medical): No    Lack of  Transportation (Non-Medical): No  Physical Activity: Inactive (10/22/2022)   Exercise Vital Sign    Days of Exercise per Week: 0 days    Minutes of Exercise per Session: 0 min  Stress: No Stress Concern Present (10/22/2022)   [place]    Feeling of Stress : Not at all  Social Connections: Not on file    Tobacco Counseling Counseling given: Not Answered   Clinical Intake:  Pre-visit preparation completed: Yes  Pain : No/denies pain     Nutritional Status: BMI 25 -29 Overweight Nutritional Risks: None Diabetes: No  How often do you need to have someone help you when you read instructions, pamphlets, or other written materials from your doctor or pharmacy?: 1 - Never  Diabetic? no  Interpreter Needed?: No  Information entered by :: NAllen LPN   Activities of Daily Living    10/22/2022    8:15 AM  In your present state of health, do you have any difficulty performing the following activities:  Hearing? 1  Comment has hearing aids  Vision? 0  Difficulty concentrating or making decisions? 0  Walking or climbing stairs? 0  Dressing or bathing? 0  Doing errands, shopping? 0  Preparing Food and eating ? N  Using the Toilet? N  In the past six months, have you accidently leaked urine? N  Do you have problems with loss of bowel control? N  Managing your Medications? N  Managing your Finances? N  Housekeeping or managing your Housekeeping? N    Patient Care Team: Glendale Chard, MD as PCP - General (Internal Medicine)  Indicate any recent Medical Services you may have received from other than [facility] providers in the past year (date may be approximate).     Assessment:   This is a routine wellness examination for Amanda [name].  Hearing/Vision screen Vision Screening - Comments:: Regular eye exams, [facility]  Dietary issues and exercise activities discussed: Current Exercise Habits: The patient  does not participate in regular exercise at present   Goals Addressed             This Visit's Progress    Patient Stated       10/22/2022, wants to lose weight       Depression Screen    10/22/2022    8:15 AM 09/03/2021    9:20 AM 08/22/2020   11:06 AM 08/16/2019   12:28 PM 01/11/2019    9:13 AM 07/12/2018    2:37 PM  PHQ 2/9 Scores  PHQ - 2 Score 0 0 0 0 0 0  PHQ- 9 Score    0      Fall Risk    10/22/2022    8:15 AM 09/03/2021    9:19  AM 08/22/2020   11:06 AM 08/16/2019   12:28 PM 01/31/2019    2:52 PM  Fall Risk   Falls in the past year? 0 0 0 0 0  Number falls in past yr: 0      Injury with Fall? 0      Risk for fall due to : Medication side effect Medication side effect Medication side effect Medication side effect   Follow up Falls prevention discussed;Education provided;Falls evaluation completed Falls evaluation completed;Falls prevention discussed;Education provided Falls evaluation completed;Education provided;Falls prevention discussed Falls evaluation completed;Education provided;Falls prevention discussed     FALL RISK PREVENTION PERTAINING TO THE HOME:  Any stairs in or around the home? No  If so, are there any without handrails? N/a Home free of loose throw rugs in walkways, pet beds, electrical cords, etc? Yes  Adequate lighting in your home to reduce risk of falls? Yes   ASSISTIVE DEVICES UTILIZED TO PREVENT FALLS:  Life alert? No  Use of a cane, walker or w/c? No  Grab bars in the bathroom? No  Shower chair or bench in shower? No  Elevated toilet seat or a handicapped toilet? No   TIMED UP AND GO:  Was the test performed? No .      Cognitive Function:        10/22/2022    8:16 AM 09/03/2021    9:20 AM 08/22/2020   11:08 AM 08/16/2019   12:29 PM  6CIT Screen  What Year? 0 points 0 points 0 points 0 points  What month? 0 points 0 points 0 points 0 points  What time? 0 points 0 points 0 points 0 points  Count back from 20 0 points 0 points 0  points 0 points  Months in reverse 0 points 0 points 0 points 0 points  Repeat phrase 0 points 0 points 0 points 0 points  Total Score 0 points 0 points 0 points 0 points    Immunizations Immunization History  Administered Date(s) Administered   DTaP 06/26/2018   Fluad Quad(high Dose 65+) 05/29/2021   Influenza-Unspecified 06/27/2018, 05/25/2019   PFIZER(Purple Top)SARS-COV-2 Vaccination 09/24/2019, 10/20/2019, 07/12/2020   Pneumococcal-Unspecified 09/12/2012, 11/20/2013    TDAP status: Up to date  Flu Vaccine status: Due, Education has been provided regarding the importance of this vaccine. Advised may receive this vaccine at local pharmacy or Health Dept. Aware to provide a copy of the vaccination record if obtained from local pharmacy or Health Dept. Verbalized acceptance and understanding.  Pneumococcal vaccine status: Up to date  Covid-19 vaccine status: Completed vaccines  Qualifies for Shingles Vaccine? Yes   Zostavax completed No   Shingrix Completed?: No.    Education has been provided regarding the importance of this vaccine. Patient has been advised to call insurance company to determine out of pocket expense if they have not yet received this vaccine. Advised may also receive vaccine at local pharmacy or Health Dept. Verbalized acceptance and understanding.  Screening Tests Health Maintenance  Topic Date Due   Zoster Vaccines- Shingrix (1 of 2) Never done   Pneumonia Vaccine 21+ Years old (1 of 1 - PCV) 08/02/2003   INFLUENZA VACCINE  02/24/2022   COVID-19 Vaccine (4 - 2023-24 season) 03/27/2022   Medicare Annual Wellness (AWV)  09/03/2022   DTaP/Tdap/Td (2 - Tdap) 06/26/2028   DEXA SCAN  Completed   HPV VACCINES  Aged Out    Health Maintenance  Health Maintenance Due  Topic Date Due   Zoster Vaccines- Shingrix (1 of  2) Never done   Pneumonia Vaccine 27+ Years old (1 of 1 - PCV) 08/02/2003   INFLUENZA VACCINE  02/24/2022   COVID-19 Vaccine (4 - 2023-24  season) 03/27/2022   Medicare Annual Wellness (AWV)  09/03/2022    Colorectal cancer screening: No longer required.   Mammogram status: No longer required due to age.  Bone Density status: Completed 01/30/2015.   Lung Cancer Screening: (Low Dose CT Chest recommended if Age 25-80 years, 30 pack-year currently smoking OR have quit w/in 15years.) does not qualify.   Lung Cancer Screening Referral: no  Additional Screening:  Hepatitis C Screening: does not qualify;   Vision Screening: Recommended annual ophthalmology exams for early detection of glaucoma and other disorders of the eye. Is the patient up to date with their annual eye exam?  Yes  Who is the provider or what is the name of the office in which the patient attends annual eye exams? [facility] If pt is not established with a provider, would they like to be referred to a provider to establish care? No .   Dental Screening: Recommended annual dental exams for proper oral hygiene  Community Resource Referral / Chronic Care Management: CRR required this visit?  No   CCM required this visit?  No      Plan:     I have personally reviewed and noted the following in the patient's chart:   Medical and social history Use of alcohol, tobacco or illicit drugs  Current medications and supplements including opioid prescriptions. Patient is not currently taking opioid prescriptions. Functional ability and status Nutritional status Physical activity Advanced directives List of other physicians Hospitalizations, surgeries, and ER visits in previous 12 months Vitals Screenings to include cognitive, depression, and falls Referrals and appointments  In addition, I have reviewed and discussed with patient certain preventive protocols, quality metrics, and best practice recommendations. A written personalized care plan for preventive services as well as general preventive health recommendations were provided to patient.      Kellie Simmering, LPN   [id number]   Nurse Notes: none  Due to this being a virtual visit, the after visit summary with patients personalized plan was offered to patient via mail or my-chart.  Patient would like to access on my-chart

## 2022-11-05 ENCOUNTER — Other Ambulatory Visit: Payer: Self-pay | Admitting: Internal Medicine

## 2022-11-13 ENCOUNTER — Other Ambulatory Visit: Payer: Self-pay | Admitting: Internal Medicine

## 2022-11-14 ENCOUNTER — Other Ambulatory Visit: Payer: Self-pay | Admitting: Internal Medicine

## 2022-11-16 ENCOUNTER — Other Ambulatory Visit: Payer: Self-pay | Admitting: Internal Medicine

## 2023-02-15 ENCOUNTER — Other Ambulatory Visit: Payer: Self-pay | Admitting: Internal Medicine

## 2023-02-23 ENCOUNTER — Other Ambulatory Visit: Payer: Self-pay | Admitting: Internal Medicine

## 2023-02-25 ENCOUNTER — Other Ambulatory Visit: Payer: Self-pay

## 2023-02-25 MED ORDER — PRAVASTATIN SODIUM 20 MG PO TABS: 20.0000 mg | ORAL_TABLET | Freq: Every day | ORAL | 2 refills | Status: AC

## 2023-04-13 ENCOUNTER — Other Ambulatory Visit: Payer: Self-pay | Admitting: Internal Medicine

## 2023-07-08 DIAGNOSIS — Z23 Encounter for immunization: Secondary | ICD-10-CM | POA: Diagnosis not present

## 2023-10-05 ENCOUNTER — Other Ambulatory Visit: Payer: Self-pay | Admitting: Internal Medicine

## 2023-11-02 DIAGNOSIS — Z961 Presence of intraocular lens: Secondary | ICD-10-CM | POA: Diagnosis not present

## 2023-11-02 DIAGNOSIS — H52203 Unspecified astigmatism, bilateral: Secondary | ICD-10-CM | POA: Diagnosis not present

## 2023-11-02 DIAGNOSIS — H1789 Other corneal scars and opacities: Secondary | ICD-10-CM | POA: Diagnosis not present

## 2023-12-24 DIAGNOSIS — I129 Hypertensive chronic kidney disease with stage 1 through stage 4 chronic kidney disease, or unspecified chronic kidney disease: Secondary | ICD-10-CM | POA: Diagnosis not present

## 2023-12-24 DIAGNOSIS — I10 Essential (primary) hypertension: Secondary | ICD-10-CM | POA: Diagnosis not present

## 2023-12-24 DIAGNOSIS — N183 Chronic kidney disease, stage 3 unspecified: Secondary | ICD-10-CM | POA: Diagnosis not present

## 2023-12-24 DIAGNOSIS — Z1330 Encounter for screening examination for mental health and behavioral disorders, unspecified: Secondary | ICD-10-CM | POA: Diagnosis not present

## 2023-12-24 DIAGNOSIS — E7800 Pure hypercholesterolemia, unspecified: Secondary | ICD-10-CM | POA: Diagnosis not present

## 2023-12-24 DIAGNOSIS — Z8739 Personal history of other diseases of the musculoskeletal system and connective tissue: Secondary | ICD-10-CM | POA: Diagnosis not present

## 2024-03-15 DIAGNOSIS — Z0000 Encounter for general adult medical examination without abnormal findings: Secondary | ICD-10-CM | POA: Diagnosis not present

## 2024-03-15 DIAGNOSIS — E7800 Pure hypercholesterolemia, unspecified: Secondary | ICD-10-CM | POA: Diagnosis not present

## 2024-03-15 DIAGNOSIS — N183 Chronic kidney disease, stage 3 unspecified: Secondary | ICD-10-CM | POA: Diagnosis not present

## 2024-03-15 DIAGNOSIS — Z23 Encounter for immunization: Secondary | ICD-10-CM | POA: Diagnosis not present

## 2024-03-15 DIAGNOSIS — I10 Essential (primary) hypertension: Secondary | ICD-10-CM | POA: Diagnosis not present

## 2024-04-05 DIAGNOSIS — Z23 Encounter for immunization: Secondary | ICD-10-CM | POA: Diagnosis not present
